# Patient Record
Sex: Female | Born: 1995 | Race: Black or African American | Hispanic: No | Marital: Married | Smoking: Never smoker
Health system: Southern US, Community
[De-identification: ages and names within clinical notes are randomized; demographics above are authoritative.]

## PROBLEM LIST (undated history)

## (undated) DIAGNOSIS — N912 Amenorrhea, unspecified: Secondary | ICD-10-CM

## (undated) DIAGNOSIS — E282 Polycystic ovarian syndrome: Secondary | ICD-10-CM

## (undated) DIAGNOSIS — D569 Thalassemia, unspecified: Secondary | ICD-10-CM

## (undated) HISTORY — DX: Polycystic ovarian syndrome: E28.2

## (undated) HISTORY — DX: Amenorrhea, unspecified: N91.2

---
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
## 2017-04-22 ENCOUNTER — Encounter (HOSPITAL_COMMUNITY): Payer: Self-pay

## 2017-04-22 ENCOUNTER — Emergency Department (HOSPITAL_COMMUNITY)
Admission: EM | Admit: 2017-04-22 | Discharge: 2017-04-22 | Disposition: A | Discharge status: Home / Self Care | Payer: BLUE CROSS/BLUE SHIELD | Attending: Emergency Medicine | Admitting: Emergency Medicine

## 2017-04-22 DIAGNOSIS — R1031 Right lower quadrant pain: Secondary | ICD-10-CM | POA: Diagnosis not present

## 2017-04-22 DIAGNOSIS — K529 Noninfective gastroenteritis and colitis, unspecified: Secondary | ICD-10-CM | POA: Diagnosis not present

## 2017-04-22 DIAGNOSIS — R197 Diarrhea, unspecified: Secondary | ICD-10-CM | POA: Diagnosis present

## 2017-04-22 LAB — POC URINE PREG, ED: PREG TEST UR: NEGATIVE

## 2017-04-22 LAB — COMPREHENSIVE METABOLIC PANEL
ALK PHOS: 61 U/L (ref 38–126)
ALT: 21 U/L (ref 14–54)
ANION GAP: 9 (ref 5–15)
AST: 25 U/L (ref 15–41)
Albumin: 4 g/dL (ref 3.5–5.0)
BILIRUBIN TOTAL: 0.4 mg/dL (ref 0.3–1.2)
BUN: 5 mg/dL — ABNORMAL LOW (ref 6–20)
CALCIUM: 9.8 mg/dL (ref 8.9–10.3)
CO2: 26 mmol/L (ref 22–32)
CREATININE: 0.86 mg/dL (ref 0.44–1.00)
Chloride: 103 mmol/L (ref 101–111)
GFR calc non Af Amer: >60 mL/min (ref >60)
GLUCOSE: 109 mg/dL — AB (ref 65–99)
Potassium: 3.7 mmol/L (ref 3.5–5.1)
Sodium: 138 mmol/L (ref 135–145)
TOTAL PROTEIN: 7.7 g/dL (ref 6.5–8.1)

## 2017-04-22 LAB — URINALYSIS, ROUTINE W REFLEX MICROSCOPIC
BILIRUBIN URINE: NEGATIVE
Bacteria, UA: NONE SEEN
GLUCOSE, UA: NEGATIVE mg/dL
Ketones, ur: NEGATIVE mg/dL
Leukocytes, UA: NEGATIVE
NITRITE: NEGATIVE
Protein, ur: NEGATIVE mg/dL
SPECIFIC GRAVITY, URINE: 1.011 (ref 1.005–1.030)
Squamous Epithelial / LPF: NONE SEEN
WBC UA: NONE SEEN WBC/hpf (ref 0–5)
pH: 7 (ref 5.0–8.0)

## 2017-04-22 LAB — CBC
HCT: 36.6 % (ref 36.0–46.0)
HEMOGLOBIN: 11.6 g/dL — AB (ref 12.0–15.0)
MCH: 23.2 pg — ABNORMAL LOW (ref 26.0–34.0)
MCHC: 31.7 g/dL (ref 30.0–36.0)
MCV: 73.1 fL — ABNORMAL LOW (ref 78.0–100.0)
PLATELETS: 268 10*3/uL (ref 150–400)
RBC: 5.01 MIL/uL (ref 3.87–5.11)
RDW: 14.1 % (ref 11.5–15.5)
WBC: 4.9 10*3/uL (ref 4.0–10.5)

## 2017-04-22 LAB — LIPASE, BLOOD: Lipase: 39 U/L (ref 11–51)

## 2017-04-22 MED ORDER — ONDANSETRON 8 MG PO TBDP: 8.0000 mg | ORAL_TABLET | Freq: Three times a day (TID) | ORAL | 0 refills | Status: DC | PRN

## 2017-04-22 MED ORDER — ONDANSETRON 4 MG PO TBDP
8.0000 mg | ORAL_TABLET | Freq: Once | ORAL | Status: AC
  Administered 2017-04-22: 8 mg via ORAL
  Filled 2017-04-22: qty 2

## 2017-04-22 NOTE — ED Provider Notes (Signed)
[facility]-EMERGENCY DEPT Provider Note   CSN: [id number] Arrival date & time: 04/22/17  0754     History   Chief Complaint Chief Complaint  Patient presents with  . Diarrhea  . Back Pain    HPI Mary Khan is a 21 y.o. female.  HPI Mary Khan is a 21 y.o. female with no medical problems, presents to emergency department complaining of nausea, diarrhea, right flank pain.atient states symptoms started yesterday. She states this morning her diarrhea has gotten worse. She reports 6 episodes of watery stool. She reports associated urinary frequency. No dysuria, urinary urgency, hematuria. She denies any emesis. She is able to drink and eat. She denies any abdominal pain. She denies any fever or chills. She states she had a UTI 3 months ago for which took antibiotics. She denies any blood in her stool. She states pain is worsened with movement. Nothing is making her symptoms better or worse. She tried Pepto-Bismol which did not help.  History reviewed. No pertinent past medical history.  There are no active problems to display for this patient.   History reviewed. No pertinent surgical history.  OB History    No data available       Home Medications    Prior to Admission medications   Medication Sig Start Date End Date Taking? Authorizing Provider  ibuprofen (ADVIL,MOTRIN) 400 MG tablet Take 1 tablet (400 mg total) by mouth every 6 (six) hours as needed for pain. 05/02/13   Burgess Amor, PA-C  naproxen (NAPROSYN) 500 MG tablet Take 1 tablet (500 mg total) by mouth 2 (two) times daily with a meal. 04/14/15   Triplett, Tammy, PA-C  Norgestimate-Ethinyl Estradiol Triphasic (ORTHO TRI-CYCLEN LO) 0.18/0.215/0.25 MG-25 MCG tab Take 1 tablet by mouth daily.    [provider]    Family History No family history on file.  Social History Social History  Substance Use Topics  . Smoking status: Never Smoker  . Smokeless tobacco: Never Used  . Alcohol use No      Allergies   Patient has no known allergies.   Review of Systems Review of Systems  Constitutional: Negative for chills and fever.  Respiratory: Negative for cough, chest tightness and shortness of breath.   Cardiovascular: Negative for chest pain, palpitations and leg swelling.  Gastrointestinal: Positive for abdominal pain, diarrhea and nausea. Negative for blood in stool and vomiting.  Genitourinary: Positive for flank pain and frequency. Negative for dysuria, hematuria, pelvic pain, urgency, vaginal bleeding, vaginal discharge and vaginal pain.  Musculoskeletal: Negative for arthralgias, myalgias, neck pain and neck stiffness.  Skin: Negative for rash.  Neurological: Negative for dizziness, weakness and headaches.  All other systems reviewed and are negative.    Physical Exam Updated Vital Signs BP 123/87   Pulse 92   Temp 98.8 F (37.1 C) (Oral)   Resp 18   Ht  (1.6 m)   Wt 63.5 kg (140 lb)   SpO2 100%   BMI 24.80 kg/m   Physical Exam  Constitutional: She is oriented to person, place, and time. She appears well-developed and well-nourished. No distress.  HENT:  Head: Normocephalic.  Eyes: Conjunctivae are normal.  Neck: Neck supple.  Cardiovascular: Normal rate, regular rhythm and normal heart sounds.   Pulmonary/Chest: Effort normal and breath sounds normal. No respiratory distress. She has no wheezes. She has no rales.  Abdominal: Soft. Bowel sounds are normal. She exhibits no distension. There is no tenderness. There is no rebound and no guarding.  No CVA tenderness bilaterally  Musculoskeletal: She exhibits no edema.  Neurological: She is alert and oriented to person, place, and time.  Skin: Skin is warm and dry.  Psychiatric: She has a normal mood and affect. Her behavior is normal.  Nursing note and vitals reviewed.    ED Treatments / Results  Labs (all labs ordered are listed, but only abnormal results are displayed) Labs Reviewed   COMPREHENSIVE METABOLIC PANEL - Abnormal; Notable for the following:       Result Value   Glucose, Bld 109 (*)    BUN 5 (*)    All other components within normal limits  CBC - Abnormal; Notable for the following:    Hemoglobin 11.6 (*)    MCV 73.1 (*)    MCH 23.2 (*)    All other components within normal limits  URINALYSIS, ROUTINE W REFLEX MICROSCOPIC - Abnormal; Notable for the following:    Color, Urine STRAW (*)    Hgb urine dipstick SMALL (*)    All other components within normal limits  LIPASE, BLOOD  POC URINE PREG, ED    EKG  EKG Interpretation None       Radiology No results found.  Procedures Procedures (including critical care time)  Medications Ordered in ED Medications  ondansetron (ZOFRAN-ODT) disintegrating tablet 8 mg (8 mg Oral Given 04/22/17 0850)     Initial Impression / Assessment and Plan / ED Course  I have reviewed the triage vital signs and the nursing notes.  Pertinent labs & imaging results that were available during my care of the patient were reviewed by me and considered in my medical decision making (see chart for details).     Patient emergency department with nausea, diarrhea, flank pain. On exam, no abdominal pain or tenderness. Patient is in no acute distress. Vital signs are normal. Will check labs, urinalysis, urine pregnancy. Zofran ordered for nausea.  9:37 AM Urine analysis with no signs of infection. Labs unremarkable. Pt not pregnant. Pt's  Nausea improved with zofran. Her abdomen is non tender. No episodes of diarrhea in ED. VS remain normal. Pt stable for dc home with close outpatient follow up. Will prescribe zofran for nausea  Vitals:   04/22/17 0757 04/22/17 0759 04/22/17 0830  BP: 134/88  123/87  Pulse: 78  92  Resp: 18    Temp: 98.8 F (37.1 C)    TempSrc: Oral    SpO2:   100%  Weight:  63.5 kg (140 lb)   Height:  5\' 3"  (1.6 m)       Final Clinical Impressions(s) / ED Diagnoses   Final diagnoses:   Gastroenteritis    New Prescriptions New Prescriptions   ONDANSETRON (ZOFRAN ODT) 8 MG DISINTEGRATING TABLET    Take 1 tablet (8 mg total) by mouth every 8 (eight) hours as needed for nausea or vomiting.     Jaynie CrumbleKirichenko, Dillon Livermore, PA-C 04/22/17 0940    Vanetta MuldersZackowski, Scott, MD 04/23/17 [phone]

## 2017-04-22 NOTE — Discharge Instructions (Signed)
Drink plenty of fluids. Rest. Take zofran as prescribed as needed for nausea and vomiting. Take imodium as needed for diarrhea. Follow up with family doctor as needed. Return if worsening.

## 2017-04-22 NOTE — ED Triage Notes (Signed)
Per Pt, Pt is coming from home with complaints of diarrhea, lower back pain, and urinary frequency that started this morning at 0200. Pt reports some nausea with no vomiting, but denies vaginal bleeding or discharge.

## 2018-07-20 ENCOUNTER — Ambulatory Visit (INDEPENDENT_AMBULATORY_CARE_PROVIDER_SITE_OTHER): Payer: BLUE CROSS/BLUE SHIELD | Admitting: Certified Nurse Midwife

## 2018-07-20 ENCOUNTER — Encounter: Payer: Self-pay | Admitting: Certified Nurse Midwife

## 2018-07-20 ENCOUNTER — Other Ambulatory Visit: Payer: Self-pay

## 2018-07-20 VITALS — BP 110/72 | HR 68 | Resp 16 | Ht 63.75 in | Wt 170.0 lb

## 2018-07-20 DIAGNOSIS — E663 Overweight: Secondary | ICD-10-CM

## 2018-07-20 DIAGNOSIS — N912 Amenorrhea, unspecified: Secondary | ICD-10-CM

## 2018-07-20 DIAGNOSIS — E049 Nontoxic goiter, unspecified: Secondary | ICD-10-CM | POA: Diagnosis not present

## 2018-07-20 DIAGNOSIS — Z3202 Encounter for pregnancy test, result negative: Secondary | ICD-10-CM

## 2018-07-20 DIAGNOSIS — Z01419 Encounter for gynecological examination (general) (routine) without abnormal findings: Secondary | ICD-10-CM | POA: Diagnosis not present

## 2018-07-20 LAB — POCT URINE PREGNANCY: Preg Test, Ur: NEGATIVE

## 2018-07-20 NOTE — Progress Notes (Signed)
22 y.o. G0P0000 Single  African American Fe here to establish gyn care and  for annual exam. LMP 04/03/18 with negative UPT at home and negative UPT here today. Patient feels she has had 3 periods this year, every 3-4 months, duration 5-6 days, 3rd day heavy, other moderate to light, some cramping with OTC medication use. Last sexual activity was 07/15/18. Steady partner, no STD screening needed. Previous use of OCP with no period in the past also. No other health issues today.  Patient's last menstrual period was 04/03/2018.          Sexually active: Yes.    The current method of family planning is non at present, condoms and pills in past.   Exercising: Yes.    zumba Smoker:  no  Review of Systems  Constitutional: Negative.   HENT: Negative.   Eyes: Negative.   Respiratory: Negative.   Cardiovascular: Negative.   Gastrointestinal: Negative.   Genitourinary:       Amenorrhea  Musculoskeletal: Negative.   Skin: Negative.   Neurological: Negative.   Endo/Heme/Allergies: Negative.   Psychiatric/Behavioral: Negative.     Health Maintenance: Pap:  Unsure, maybe done at health department History of Abnormal Pap: no MMG:  none Self Breast exams: no Colonoscopy:  none BMD:   none TDaP:  2017 Shingles: no Pneumonia: no Hep C and HIV: not done Labs: UPT-neg   reports that she has never smoked. She has never used smokeless tobacco. She reports that she does not drink alcohol or use drugs.  Past Medical History:  Diagnosis Date  . Amenorrhea     History reviewed. No pertinent surgical history.  No current outpatient medications on file.   No current facility-administered medications for this visit.     Family History  Problem Relation Age of Onset  . Diabetes Mother   . Hyperlipidemia Maternal Grandmother   . Diabetes Paternal Grandmother   . Diabetes Paternal Grandfather     ROS:  Pertinent items are noted in HPI.  Otherwise, a comprehensive ROS was negative.  Exam:    BP 110/72   Pulse 68   Resp 16   Ht 5' 3.75" (1.619 m)   Wt 170 lb (77.1 kg)   LMP 04/03/2018   BMI 29.41 kg/m  Height: 5' 3.75" (161.9 cm) Ht Readings from Last 3 Encounters:  07/20/18 5' 3.75" (1.619 m)  04/22/17 5\' 3"  (1.6 m) (31 %, Z= -0.50)*  04/14/15 5\' 4"  (1.626 m) (48 %, Z= -0.05)*   * Growth percentiles are based on CDC (Girls, 2-20 Years) data.    General appearance: alert, cooperative and appears stated age Head: Normocephalic, without obvious abnormality, atraumatic Neck: no adenopathy, supple, symmetrical, trachea midline and thyroid enlarged and questionable nodule palpated on right Lungs: clear to auscultation bilaterally Breasts: normal appearance, no masses or tenderness, No nipple retraction or dimpling, No nipple discharge or bleeding, No axillary or supraclavicular adenopathy, Taught monthly breast self examination Heart: regular rate and rhythm Abdomen: soft, non-tender; no masses,  no organomegaly Extremities: extremities normal, atraumatic, no cyanosis or edema Skin: Skin color, texture, turgor normal. No rashes or lesions Lymph nodes: Cervical, supraclavicular, and axillary nodes normal. No abnormal inguinal nodes palpated Neurologic: Grossly normal   Pelvic: External genitalia:  no lesions              Urethra:  normal appearing urethra with no masses, tenderness or lesions              Bartholin's and Skene's:  normal                 Vagina: normal appearing vagina with normal color and discharge, no lesions              Cervix: no cervical motion tenderness, no lesions and nulliparous appearance              Pap taken: No. Bimanual Exam:  Uterus:  normal size, contour, position, consistency, mobility, non-tender and anteverted              Adnexa: normal adnexa and no mass, fullness, tenderness               Rectovaginal: Confirms               Anus:  Normal appearance no lesions  Chaperone present: yes  A:  Well Woman with normal  exam  Amenorrhea with irregular cycles, negative  UPT   History of amenorrhea with OCP use for cycle control and contraception, stopped using condoms for contraception  Enlarged thyroid  Overweight  P:   Reviewed health and wellness pertinent to exam  Discussed amenorrhea can occur with hormone  Or weight change, pituitary change or thyroid changes. Recommend labs for evaluation agreeable. Declines contraception at this point.  Lab: TSH with panel, Prolactin, FSH  Discussed evaluation of thyroid with US and she will be called with information to schedule  Discussed working on good diet and exercise, can help with hormonal changes. Patient to continue to keep period calendar and consistent condom use, so can treat with Provera challenge if needed. Questions addressed at length.  Pap smear: no   counseled on breast self exam, STD prevention, HIV risk factors and prevention, family planning choices, adequate intake of calcium and vitamin D, diet and exercise  return annually or prn  An After Visit Summary was printed and given to the patient.

## 2018-07-20 NOTE — Patient Instructions (Signed)
General topics  Next pap or exam is  due in 1 year Take a Women's multivitamin Take 1200 mg. of calcium daily - prefer dietary If any concerns in interim to call back  Breast Self-Awareness Practicing breast self-awareness may pick up problems early, prevent significant medical complications, and possibly save your life. By practicing breast self-awareness, you can become familiar with how your breasts look and feel and if your breasts are changing. This allows you to notice changes early. It can also offer you some reassurance that your breast health is good. One way to learn what is normal for your breasts and whether your breasts are changing is to do a breast self-exam. If you find a lump or something that was not present in the past, it is best to contact your caregiver right away. Other findings that should be evaluated by your caregiver include nipple discharge, especially if it is bloody; skin changes or reddening; areas where the skin seems to be pulled in (retracted); or new lumps and bumps. Breast pain is seldom associated with cancer (malignancy), but should also be evaluated by a caregiver. BREAST SELF-EXAM The best time to examine your breasts is 5 7 days after your menstrual period is over.  ExitCare Patient Information 2013 [employer].   Exercise to Stay Healthy Exercise helps you become and stay healthy. EXERCISE IDEAS AND TIPS Choose exercises that:  You enjoy.  Fit into your day. You do not need to exercise really hard to be healthy. You can do exercises at a slow or medium level and stay healthy. You can:  Stretch before and after working out.  Try yoga, Pilates, or tai chi.  Lift weights.  Walk fast, swim, jog, run, climb stairs, bicycle, dance, or rollerskate.  Take aerobic classes. Exercises that burn about 150 calories:  Running 1  miles in 15 minutes.  Playing volleyball for 45 to 60 minutes.  Washing and waxing a car for 45 to 60  minutes.  Playing touch football for 45 minutes.  Walking 1  miles in 35 minutes.  Pushing a stroller 1  miles in 30 minutes.  Playing basketball for 30 minutes.  Raking leaves for 30 minutes.  Bicycling 5 miles in 30 minutes.  Walking 2 miles in 30 minutes.  Dancing for 30 minutes.  Shoveling snow for 15 minutes.  Swimming laps for 20 minutes.  Walking up stairs for 15 minutes.  Bicycling 4 miles in 15 minutes.  Gardening for 30 to 45 minutes.  Jumping rope for 15 minutes.  Washing windows or floors for 45 to 60 minutes. Document Released: 09/03/2010 Document Revised: 10/24/2011 Document Reviewed: 09/03/2010 ExitCare Patient Information 2013 [employer].   Other topics ( that may be useful information):    Sexually Transmitted Disease Sexually transmitted disease (STD) refers to any infection that is passed from person to person during sexual activity. This may happen by way of saliva, semen, blood, vaginal mucus, or urine. Common STDs include:  Gonorrhea.  Chlamydia.  Syphilis.  HIV/AIDS.  Genital herpes.  Hepatitis B and C.  Trichomonas.  Human papillomavirus (HPV).  Pubic lice. CAUSES  An STD may be spread by bacteria, virus, or parasite. A person can get an STD by:  Sexual intercourse with an infected person.  Sharing sex toys with an infected person.  Sharing needles with an infected person.  Having intimate contact with the genitals, mouth, or rectal areas of an infected person. SYMPTOMS  Some people may not have any symptoms, but   they can still pass the infection to others. Different STDs have different symptoms. Symptoms include:  Painful or bloody urination.  Pain in the pelvis, abdomen, vagina, anus, throat, or eyes.  Skin rash, itching, irritation, growths, or sores (lesions). These usually occur in the genital or anal area.  Abnormal vaginal discharge.  Penile discharge in men.  Soft, flesh-colored skin growths in the  genital or anal area.  Fever.  Pain or bleeding during sexual intercourse.  Swollen glands in the groin area.  Yellow skin and eyes (jaundice). This is seen with hepatitis. DIAGNOSIS  To make a diagnosis, your caregiver may:  Take a medical history.  Perform a physical exam.  Take a specimen (culture) to be examined.  Examine a sample of discharge under a microscope.  Perform blood test TREATMENT   Chlamydia, gonorrhea, trichomonas, and syphilis can be cured with antibiotic medicine.  Genital herpes, hepatitis, and HIV can be treated, but not cured, with prescribed medicines. The medicines will lessen the symptoms.  Genital warts from HPV can be treated with medicine or by freezing, burning (electrocautery), or surgery. Warts may come back.  HPV is a virus and cannot be cured with medicine or surgery.However, abnormal areas may be followed very closely by your caregiver and may be removed from the cervix, vagina, or vulva through office procedures or surgery. If your diagnosis is confirmed, your recent sexual partners need treatment. This is true even if they are symptom-free or have a negative culture or evaluation. They should not have sex until their caregiver says it is okay. HOME CARE INSTRUCTIONS  All sexual partners should be informed, tested, and treated for all STDs.  Take your antibiotics as directed. Finish them even if you start to feel better.  Only take over-the-counter or prescription medicines for pain, discomfort, or fever as directed by your caregiver.  Rest.  Eat a balanced diet and drink enough fluids to keep your urine clear or pale yellow.  Do not have sex until treatment is completed and you have followed up with your caregiver. STDs should be checked after treatment.  Keep all follow-up appointments, Pap tests, and blood tests as directed by your caregiver.  Only use latex condoms and water-soluble lubricants during sexual activity. Do not use  petroleum jelly or oils.  Avoid alcohol and illegal drugs.  Get vaccinated for HPV and hepatitis. If you have not received these vaccines in the past, talk to your caregiver about whether one or both might be right for you.  Avoid risky sex practices that can break the skin. The only way to avoid getting an STD is to avoid all sexual activity.Latex condoms and dental dams (for oral sex) will help lessen the risk of getting an STD, but will not completely eliminate the risk. SEEK MEDICAL CARE IF:   You have a fever.  You have any new or worsening symptoms. Document Released: 10/22/2002 Document Revised: 10/24/2011 Document Reviewed: 10/29/2010 [facility] Patient Information 2013 [employer].    Domestic Abuse You are being battered or abused if someone close to you hits, pushes, or physically hurts you in any way. You also are being abused if you are forced into activities. You are being sexually abused if you are forced to have sexual contact of any kind. You are being emotionally abused if you are made to feel worthless or if you are constantly threatened. It is important to remember that help is available. No one has the right to abuse you. PREVENTION OF FURTHER  ABUSE  Learn the warning signs of danger. This varies with situations but may include: the use of alcohol, threats, isolation from friends and family, or forced sexual contact. Leave if you feel that violence is going to occur.  If you are attacked or beaten, report it to the police so the abuse is documented. You do not have to press charges. The police can protect you while you or the attackers are leaving. Get the officer's name and badge number and a copy of the report.  Find someone you can trust and tell them what is happening to you: your caregiver, a nurse, clergy member, close friend or family member. Feeling ashamed is natural, but remember that you have done nothing wrong. No one deserves abuse. Document Released:  07/29/2000 Document Revised: 10/24/2011 Document Reviewed: 10/07/2010 ExitCare Patient Information 2013 [employer].    How Much is Too Much Alcohol? Drinking too much alcohol can cause injury, accidents, and health problems. These types of problems can include:   Car crashes.  Falls.  Family fighting (domestic violence).  Drowning.  Fights.  Injuries.  Burns.  Damage to certain organs.  Having a baby with birth defects. ONE DRINK CAN BE TOO MUCH WHEN YOU ARE:  Working.  Pregnant or breastfeeding.  Taking medicines. Ask your doctor.  Driving or planning to drive. If you or someone you know has a drinking problem, get help from a doctor.  Document Released: 05/28/2009 Document Revised: 10/24/2011 Document Reviewed: 05/28/2009 ExitCare Patient Information 2013 [employer].   Smoking Hazards Smoking cigarettes is extremely bad for your health. Tobacco smoke has over 200 known poisons in it. There are over 60 chemicals in tobacco smoke that cause cancer. Some of the chemicals found in cigarette smoke include:   Cyanide.  Benzene.  Formaldehyde.  Methanol (wood alcohol).  Acetylene (fuel used in welding torches).  Ammonia. Cigarette smoke also contains the poisonous gases nitrogen oxide and carbon monoxide.  Cigarette smokers have an increased risk of many serious medical problems and Smoking causes approximately:  90% of all lung cancer deaths in men.  80% of all lung cancer deaths in women.  90% of deaths from chronic obstructive lung disease. Compared with nonsmokers, smoking increases the risk of:  Coronary heart disease by 2 to 4 times.  Stroke by 2 to 4 times.  Men developing lung cancer by 23 times.  Women developing lung cancer by 13 times.  Dying from chronic obstructive lung diseases by 12 times.  . Smoking is the most preventable cause of death and disease in our society.  WHY IS SMOKING ADDICTIVE?  Nicotine is the chemical  agent in tobacco that is capable of causing addiction or dependence.  When you smoke and inhale, nicotine is absorbed rapidly into the bloodstream through your lungs. Nicotine absorbed through the lungs is capable of creating a powerful addiction. Both inhaled and non-inhaled nicotine may be addictive.  Addiction studies of cigarettes and spit tobacco show that addiction to nicotine occurs mainly during the teen years, when young people begin using tobacco products. WHAT ARE THE BENEFITS OF QUITTING?  There are many health benefits to quitting smoking.   Likelihood of developing cancer and heart disease decreases. Health improvements are seen almost immediately.  Blood pressure, pulse rate, and breathing patterns start returning to normal soon after quitting. QUITTING SMOKING   American Lung Association - 1-800-LUNGUSA  American Cancer Society - 1-800-ACS-2345 Document Released: 09/08/2004 Document Revised: 10/24/2011 Document Reviewed: 05/13/2009 ExitCare Patient Information 2013 [employer]Care[employer]   LL[employer].   Stress Management Stress is a state of physical or mental tension that often results from changes in your life or normal routine. Some common causes of stress are:  Death of a loved one.  Injuries or severe illnesses.  Getting fired or changing jobs.  Moving into a new home. Other causes may be:  Sexual problems.  Business or financial losses.  Taking on a large debt.  Regular conflict with someone at home or at work.  Constant tiredness from lack of sleep. It is not just bad things that are stressful. It may be stressful to:  Win the lottery.  Get married.  Buy a new car. The amount of stress that can be easily tolerated varies from person to person. Changes generally cause stress, regardless of the types of change. Too much stress can affect your health. It may lead to physical or emotional problems. Too little stress (boredom) may also become stressful. SUGGESTIONS TO  REDUCE STRESS:  Talk things over with your family and friends. It often is helpful to share your concerns and worries. If you feel your problem is serious, you may want to get help from a professional counselor.  Consider your problems one at a time instead of lumping them all together. Trying to take care of everything at once may seem impossible. List all the things you need to do and then start with the most important one. Set a goal to accomplish 2 or 3 things each day. If you expect to do too many in a single day you will naturally fail, causing you to feel even more stressed.  Do not use alcohol or drugs to relieve stress. Although you may feel better for a short time, they do not remove the problems that caused the stress. They can also be habit forming.  Exercise regularly - at least 3 times per week. Physical exercise can help to relieve that "uptight" feeling and will relax you.  The shortest distance between despair and hope is often a good night's sleep.  Go to bed and get up on time allowing yourself time for appointments without being rushed.  Take a short "time-out" period from any stressful situation that occurs during the day. Close your eyes and take some deep breaths. Starting with the muscles in your face, tense them, hold it for a few seconds, then relax. Repeat this with the muscles in your neck, shoulders, hand, stomach, back and legs.  Take good care of yourself. Eat a balanced diet and get plenty of rest.  Schedule time for having fun. Take a break from your daily routine to relax. HOME CARE INSTRUCTIONS   Call if you feel overwhelmed by your problems and feel you can no longer manage them on your own.  Return immediately if you feel like hurting yourself or someone else. Document Released: 01/25/2001 Document Revised: 10/24/2011 Document Reviewed: 09/17/2007 ExitCare Patient Information 2013 [employer].   

## 2018-07-21 LAB — THYROID PANEL WITH TSH
FREE THYROXINE INDEX: 2.9 (ref 1.2–4.9)
T3 UPTAKE RATIO: 29 % (ref 24–39)
T4, Total: 10 ug/dL (ref 4.5–12.0)
TSH: 1.97 u[IU]/mL (ref 0.450–4.500)

## 2018-07-21 LAB — PROLACTIN: PROLACTIN: 12.9 ng/mL (ref 4.8–23.3)

## 2018-07-21 LAB — FOLLICLE STIMULATING HORMONE: FSH: 4.8 m[IU]/mL

## 2018-07-22 ENCOUNTER — Other Ambulatory Visit: Payer: Self-pay | Admitting: Certified Nurse Midwife

## 2018-07-22 DIAGNOSIS — E049 Nontoxic goiter, unspecified: Secondary | ICD-10-CM

## 2018-07-22 DIAGNOSIS — N912 Amenorrhea, unspecified: Secondary | ICD-10-CM

## 2018-07-24 ENCOUNTER — Telehealth: Payer: Self-pay | Admitting: Certified Nurse Midwife

## 2018-07-24 ENCOUNTER — Ambulatory Visit
Admission: RE | Admit: 2018-07-24 | Discharge: 2018-07-24 | Disposition: A | Discharge status: Home / Self Care | Payer: BLUE CROSS/BLUE SHIELD | Source: Ambulatory Visit | Attending: Certified Nurse Midwife | Admitting: Certified Nurse Midwife

## 2018-07-24 DIAGNOSIS — E049 Nontoxic goiter, unspecified: Secondary | ICD-10-CM

## 2018-07-24 DIAGNOSIS — E011 Iodine-deficiency related multinodular (endemic) goiter: Secondary | ICD-10-CM | POA: Diagnosis not present

## 2018-07-24 NOTE — Telephone Encounter (Signed)
Patient requesting results from lab work.

## 2018-07-24 NOTE — Telephone Encounter (Signed)
Left message to call [facility]ssonJill, [facility] [phone].   Notes recorded by Verner CholLeonard, Deborah S, CNM on 07/22/2018 at 9:38 PM EST Notify patient her prolactin level, TSH and FSH are normal. She will need serum HCG in two weeks if negative will do provera challenge. We discussed consistent condom use or no sexual activity , so she could be treated once HCG level in. Order placed. TSH with panel is normal, but thyroid enlarged with questionable nodule, order placed and she will be called with information .

## 2018-07-25 NOTE — Telephone Encounter (Signed)
Patient is returning a call to Jill. °

## 2018-07-25 NOTE — Telephone Encounter (Signed)
-----   Message from Verner Choleborah S Leonard, CNM sent at 07/24/2018  8:03 PM EST ----- Notify patient her thyroid US showed only enlargement, no nodules. No further evaluation needed. Will follow in office with aex yearly

## 2018-07-25 NOTE — Telephone Encounter (Signed)
Left message to call Pratyush Ammon, RN at [facility] [phone].   

## 2018-07-25 NOTE — Telephone Encounter (Signed)
Spoke with patient, advised of all results as seen below per Leota Sauerseborah Leonard, CNM. Lab appt scheduled for 08/09/18 at 9am. Patient verbalizes understanding and is agreeable. Encounter closed.

## 2018-08-06 ENCOUNTER — Telehealth: Payer: Self-pay | Admitting: Certified Nurse Midwife

## 2018-08-06 NOTE — Telephone Encounter (Signed)
Ok to cancel hCG lab.

## 2018-08-06 NOTE — Telephone Encounter (Signed)
Left detailed message, ok per dpr. Advised ok to cancel Hcg lab scheduled for 12/26, appt cacncelled. Return call to office if any additional questions.   Encounter closed

## 2018-08-06 NOTE — Telephone Encounter (Signed)
Hi Dr. Darcel BayleyLeonard,    My period has finally made its arrival! Did you still want me to come in for the HCG blood test on the 26th?

## 2018-08-06 NOTE — Telephone Encounter (Signed)
07/20/18 AEX with DL Reviewed labs dated 46/04/6211/6/19. Hcg for amenorrhea, provera if negative.  Prolactin, FSH, TSH normal.   Routing to covering provider to advise on hcg labs.   Dr. Edward JollySilva -ok to cancel Hcg labs?   Cc: Leota Sauerseborah Leonard, CNM

## 2018-08-09 ENCOUNTER — Other Ambulatory Visit (INDEPENDENT_AMBULATORY_CARE_PROVIDER_SITE_OTHER): Payer: BLUE CROSS/BLUE SHIELD

## 2018-08-09 ENCOUNTER — Other Ambulatory Visit: Payer: Self-pay

## 2018-08-09 DIAGNOSIS — N912 Amenorrhea, unspecified: Secondary | ICD-10-CM | POA: Diagnosis not present

## 2018-08-10 LAB — HCG, SERUM, QUALITATIVE: hCG,Beta Subunit,Qual,Serum: NEGATIVE m[IU]/mL (ref <6)

## 2018-09-19 ENCOUNTER — Telehealth: Payer: Self-pay | Admitting: Certified Nurse Midwife

## 2018-09-19 NOTE — Telephone Encounter (Signed)
Message   Hey Dr. Darcel Bayley,    I have not heard from you. Could I possibly be prescribed a prescription like Clomid or Provera as I am not sure that I am ovulating since my periods are 3-4 months apart.    Thank you

## 2018-09-19 NOTE — Telephone Encounter (Signed)
Spoke with patient. Patient returned to office on 08/09/18 for repeat Hcg, if negative was to start provera. No Rx for provera sent.  LMP 08/05/18.   Patient is SA, does not use condoms for contraceptive. Last SA 1 wk ago.   Advised I will review with Leota Sauers, CNM to advise on how to proceed and return call, patient agreeable.    Leota Sauers, CNM -please advise.

## 2018-09-20 NOTE — Telephone Encounter (Signed)
Spoke with patient, advised as seen below per Deborah Leonard, CNM. Patient verbalizes understanding and is agreeable.   Encounter closed.  

## 2018-09-20 NOTE — Telephone Encounter (Signed)
If she had period in 12/19. I would not do Provera until she has gone without period for 3 months then would treat with Provera. If she starts period this month needs to call and we can start contraception with this period to try to re-establish regular menses

## 2018-10-10 DIAGNOSIS — Z6830 Body mass index (BMI) 30.0-30.9, adult: Secondary | ICD-10-CM | POA: Diagnosis not present

## 2018-10-10 DIAGNOSIS — N926 Irregular menstruation, unspecified: Secondary | ICD-10-CM | POA: Diagnosis not present

## 2018-10-10 DIAGNOSIS — Z3202 Encounter for pregnancy test, result negative: Secondary | ICD-10-CM | POA: Diagnosis not present

## 2018-10-29 DIAGNOSIS — N926 Irregular menstruation, unspecified: Secondary | ICD-10-CM | POA: Diagnosis not present

## 2018-12-25 DIAGNOSIS — E049 Nontoxic goiter, unspecified: Secondary | ICD-10-CM | POA: Diagnosis not present

## 2018-12-25 DIAGNOSIS — Z1389 Encounter for screening for other disorder: Secondary | ICD-10-CM | POA: Diagnosis not present

## 2018-12-25 DIAGNOSIS — Z68.41 Body mass index (BMI) pediatric, less than 5th percentile for age: Secondary | ICD-10-CM | POA: Diagnosis not present

## 2019-04-03 DIAGNOSIS — E282 Polycystic ovarian syndrome: Secondary | ICD-10-CM | POA: Diagnosis not present

## 2019-05-09 ENCOUNTER — Ambulatory Visit: Payer: BLUE CROSS/BLUE SHIELD | Admitting: Internal Medicine

## 2019-05-10 ENCOUNTER — Other Ambulatory Visit: Payer: Self-pay

## 2019-05-14 ENCOUNTER — Ambulatory Visit (INDEPENDENT_AMBULATORY_CARE_PROVIDER_SITE_OTHER): Payer: BC Managed Care – PPO | Admitting: Internal Medicine

## 2019-05-14 ENCOUNTER — Encounter: Payer: Self-pay | Admitting: Internal Medicine

## 2019-05-14 ENCOUNTER — Other Ambulatory Visit: Payer: Self-pay

## 2019-05-14 VITALS — BP 120/78 | HR 90 | Ht 63.75 in | Wt 162.0 lb

## 2019-05-14 DIAGNOSIS — E282 Polycystic ovarian syndrome: Secondary | ICD-10-CM | POA: Diagnosis not present

## 2019-05-14 DIAGNOSIS — E049 Nontoxic goiter, unspecified: Secondary | ICD-10-CM

## 2019-05-14 NOTE — Patient Instructions (Signed)
Please return for labs in am, in the 3rd-5th day of your next menstrual cycle.  Please look up Resveratrol.   Please schedule an appt with Mary Khan with nutrition.  Please come back for a follow-up appointment in 6 months.

## 2019-05-14 NOTE — Progress Notes (Addendum)
Patient ID: Mary Khan, female   DOB: [birth year], 23 y.o.   MRN: [id number]    HPI  Mary Khan is a 23 y.o.-year-old female, referred by Dr. Phillips Odor, for evaluation for goiter and PCOS.  Goiter: Thyroid U/S (07/24/2018): Normal sized thyroid, without nodules: Parenchymal Echotexture: Normal Isthmus: 0.1 cm Right lobe: 4.2 x 1.7 x 1.8 cm Left lobe: 4.3 x 1.5 x 1.5 cm Estimated total number of nodules >/= 1 cm: 0 IMPRESSION: Normal sonographic appearance of the thyroid gland.  Pt denies: - feeling nodules in neck - hoarseness - dysphagia - choking - SOB with lying down  I reviewed pt's most recent thyroid tests-normal: 12/25/2018: TSH 1.28, total T4 9 (4.5-12) 11/06/2018: TSH 2.55 Lab Results  Component Value Date   TSH 1.970 07/20/2018    She has family history of thyroid disease in grandmother and grandfather. No recent contrast studies. No steroid use. No herbal supplements. No Biotin supplements or Hair, Skin and Nails vitamins.  PCOS:  Fertility/Menstrual cycles: - menarche at 23 y/o, initially regular then irregular menses at 23 y/o after starting OCPs  - no h/o ovarian cysts  - children: 0 - miscarriages: 0 - contraception: OCP x 2 years, then Depo Provera >> menses stopped at 69. She was on DepoProvera x 1 year >> amenorrhea >> restarted OCPs >> now irregular menses + spotting.  She was having 4 cycles a year, now monthly in last 2 mo, LMP 04/29/2019.  Acne: - no, rarely gets 1 or 2 acne spots  Hirsutism: - on upper neck and chin - plucks - also in aunt  Weight gain: - in 2013: weight 115 lbs - gained ~30 lbs on Provera: size 3 >> 10 - max weight 175 - no steroid use - no weight loss meds - Meals: - Breakfast: eggs, toast - Lunch: salad - Dinner: salmon, broccoli, sweet potatoes - Snacks:  Greek yoghurt, nuts Drinks: no - Diets tried: fish, salads - Exercise: walking 2x a week  Treatments tried: - tried Metformin >> diarrhea, nausea,  dizziness >> now off - did not try Spironolactone - did not try Vaniqa - off OCPs since 23 y/o - On Ovasitol  Labs reviewed per records brought by patient: 11/06/2018:  17HO Progesterone 32 DHEAS 474.1 (110-431.7) FSH/LH 6.9/13.3 PRL 17.2 T Testosterone/freeTestosterone: 51/7 (8-48/0-4.2)  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 She just bought a house.   ROS: Constitutional: + Fatigue, + poor sleep Eyes: no blurry vision, no xerophthalmia ENT: no sore throat,  + see HPI Cardiovascular: no CP/SOB/palpitations/leg swelling Respiratory: no cough/SOB Gastrointestinal: no N/V/D/C Musculoskeletal: no muscle/joint aches Skin: no rashes + excessive hair growth on chin and neck (see HPI) Neurological: no tremors/numbness/tingling/dizziness Psychiatric: no depression/+ anxiety + Low libido + Irregular menses (see HPI)  Past Medical History:  Diagnosis Date  . Amenorrhea    No past surgical history on file. Social History   Socioeconomic History  . Marital status: Single    Spouse name: Not on file  . Number of children: 0  . Years of education: Not on file  . Highest education level: Not on file  Occupational History  .  Just finished college, Stage manager  Social Needs  . Financial resource strain: Not on file  . Food insecurity    Worry: Not on file    Inability: Not on file  . Transportation needs    Medical: Not on file    Non-medical: Not on file  Tobacco Use  .  Smoking status: Never Smoker  . Smokeless tobacco: Never Used  Substance and Sexual Activity  . Alcohol use: No  . Drug use: No  . Sexual activity: Yes    Partners: Male    Birth control/protection: None   No current outpatient medications on file prior to visit.   No current facility-administered medications on file prior to visit.    No Known Allergies Family History  Problem Relation Age of Onset  . Diabetes Mother   . Hyperlipidemia Maternal Grandmother   . Diabetes Paternal Grandmother    . Diabetes Paternal Grandfather   HL, heart disease, thyroid disease in grandmother Thyroid disease in grandfather  PE: BP 120/78   Pulse 90   Ht 5' 3.75" (1.619 m) Comment: measured without shoes  Wt 162 lb (73.5 kg)   LMP 04/29/2019   SpO2 98%   BMI 28.03 kg/m  Wt Readings from Last 3 Encounters:  05/14/19 162 lb (73.5 kg)  07/20/18 170 lb (77.1 kg)  04/22/17 140 lb (63.5 kg) (72 %, Z= 0.58)*   * Growth percentiles are based on CDC (Girls, 2-20 Years) data.   Constitutional: Slightly overweight, in NAD Eyes: PERRLA, EOMI, no exophthalmos ENT: moist mucous membranes, no palpable thyromegaly, but prominent neck, no cervical lymphadenopathy Cardiovascular: RRR, No MRG Respiratory: CTA B Gastrointestinal: abdomen soft, NT, ND, BS+ Musculoskeletal: no deformities, strength intact in all 4;  Skin: moist, warm, no rashes, + no acne, + few wiry hair shafts on chin and neck, lanugo on sideburns, no acanthosis nigricans Neurological: no tremor with outstretched hands, DTR normal in all 4  ASSESSMENT: 1. Goiter  2.  PCOS  PLAN: 1.  Goiter - I reviewed the report of her thyroid ultrasound along with the patient. I pointed out that the thyroid is normal in size, without nodules. -Her thyroid tests were normal in 2019 and 2020 and she does not have a history of hypo-or hyperthyroidism -We discussed that the perceived thyromegaly on exam is most likely due to positioning of the thyroid more anteriorly in the neck or due to her slightly prominent sternocleidomastoid muscles, but not to an increase in the gland size or any nodules -No follow-up is necessary  2.  PCOS -We reviewed together her labs from OB/GYN which point towards PCOS: High testosterone and DHEAS, LH/FSH 2/1, normal prolactin and 17 hydroxyprogesterone. I had a long discussion with the patient about the fact that the PCOS is a misnomer, a patient does not necessarily have to have polycystic ovaries to be diagnosed with  the disorder. This is of sum of several conditions, including:  weight gain  insulin resistance (and therefore a higher risk of developing diabetes later in life)  acne  hirsutism  irregular menstrual cycles  decreased fertility. - We also discussed about the fact that the treatment is usually targeted to addressing the problem that concerns the patient the most: acne/hirsutism, weight gain, or fertility, but there is no single treatment for PCOS.  In her case, she is mostly worried about her irregular menstrual cycles but would also like to lose weight. - The first-line therapy are oral contraceptives. If she is concerned with her weight, we can use metformin; if she is concerned about acne/hirsutism, we can add spironolactone; and if she is concerned about fertility, I could refer her to reproductive endocrinology for possible use of clomiphene. -She used oral contraceptives in the past but developed amenorrhea afterwards and it took a long time to resume menstrual cycles.  She has  at least 4 cycles a year and in the last 2 months she got her menstrual cycle every month.  We discussed that, to prevent uterine lining hyperplasia, she needs to have at least 4 cycles a year.  She does not have a significant amount of hirsutism.  In this case, I would not suggest OCPs at is the first line.  However, we discussed that metformin can help with normalizing menstrual cycles and can also help with weight loss.  However, since she could not tolerate regular metformin, I suggested metformin ER.  She agrees to try this.  We will try a lower dose.  If she cannot tolerate metformin, I advised her to look up resveratrol.  She is already on inositol, which I advised her to continue. -We also discussed about the importance of losing weight and I suggested to increase the amount of exercise, by adding cardio and high intensity interval training to her walking sessions.  She also needs to build up to 5 exercise  episodes a week. -We also discussed about a referral to nutrition, with which she agrees -I would first want to repeat her hormonal labs to see if there is any improvement from 6 months ago (but will not repeat the prolactin and 17 hydroxyprogesterone).  I will also add an HbA1c.  She will return 3 to 5 days after her first day of menstrual cycle for the above labs.  We cannot check the labs today since she may be ovulating. -I will see the patient back in 6 months  We will most likely start metformin ER after labs are back.  Orders Placed This Encounter  Procedures  . Luteinizing hormone  . Follicle stimulating hormone  . Testosterone Free with SHBG  . DHEA-Sulfate, Serum  . Estradiol  . Hemoglobin A1c  . Amb ref to Medical Nutrition Therapy-MNT   Component     Latest Ref Rng & Units 05/30/2019  Testosterone, Serum (Total)     ng/dL 20  % Free Testosterone     % 2.9  Free Testosterone, S     pg/mL 5.8  Sex Hormone Binding Globulin     nmol/L 17.4 (L)  Estradiol, Free     pg/mL 0.57  Estradiol     pg/mL 20  FSH     mIU/ML 5.7  Hemoglobin A1C     4.6 - 6.5 % 5.8  DHEA-Sulfate, LCMS     ug/dL 474 (H)  LH     mIU/mL 5.28   Normal free testosterone but high DHEA-S.  DHEA-S is and androgen precursor any can be elevated in PCOS.  I would suggest to start Metformin ER.  We will send this to her pharmacy.  Philemon Kingdom, MD PhD [facility]

## 2019-05-17 ENCOUNTER — Other Ambulatory Visit: Payer: Self-pay

## 2019-05-17 DIAGNOSIS — Z20822 Contact with and (suspected) exposure to covid-19: Secondary | ICD-10-CM

## 2019-05-18 LAB — NOVEL CORONAVIRUS, NAA: SARS-CoV-2, NAA: NOT DETECTED

## 2019-05-30 ENCOUNTER — Other Ambulatory Visit: Payer: Self-pay

## 2019-05-30 ENCOUNTER — Other Ambulatory Visit (INDEPENDENT_AMBULATORY_CARE_PROVIDER_SITE_OTHER): Payer: BC Managed Care – PPO

## 2019-05-30 DIAGNOSIS — E282 Polycystic ovarian syndrome: Secondary | ICD-10-CM | POA: Diagnosis not present

## 2019-05-30 LAB — FOLLICLE STIMULATING HORMONE: FSH: 5.7 m[IU]/mL

## 2019-05-30 LAB — HEMOGLOBIN A1C: Hgb A1c MFr Bld: 5.8 % (ref 4.6–6.5)

## 2019-05-30 LAB — LUTEINIZING HORMONE: LH: 5.28 m[IU]/mL

## 2019-06-05 LAB — ESTRADIOL, FREE
Estradiol, Free: 0.57 pg/mL
Estradiol: 20 pg/mL

## 2019-06-07 DIAGNOSIS — Z6827 Body mass index (BMI) 27.0-27.9, adult: Secondary | ICD-10-CM | POA: Diagnosis not present

## 2019-06-07 DIAGNOSIS — Z118 Encounter for screening for other infectious and parasitic diseases: Secondary | ICD-10-CM | POA: Diagnosis not present

## 2019-06-07 DIAGNOSIS — G43909 Migraine, unspecified, not intractable, without status migrainosus: Secondary | ICD-10-CM | POA: Diagnosis not present

## 2019-06-07 DIAGNOSIS — E663 Overweight: Secondary | ICD-10-CM | POA: Diagnosis not present

## 2019-06-11 ENCOUNTER — Encounter: Payer: Self-pay | Admitting: Internal Medicine

## 2019-06-15 LAB — TESTOSTERONE, FREE AND TOTAL (INCLUDES SHBG)-(MALES)
% Free Testosterone: 2.9 %
Free Testosterone, S: 5.8 pg/mL
Sex Hormone Binding Globulin: 17.4 nmol/L — ABNORMAL LOW
Testosterone, Serum (Total): 20 ng/dL

## 2019-06-15 LAB — DHEA-SULFATE, SERUM: DHEA-Sulfate, LCMS: 474 ug/dL — ABNORMAL HIGH

## 2019-06-17 MED ORDER — METFORMIN HCL ER 750 MG PO TB24: 750.0000 mg | ORAL_TABLET | Freq: Two times a day (BID) | ORAL | 3 refills | Status: DC

## 2019-06-17 NOTE — Addendum Note (Signed)
Addended by: Philemon Kingdom on: 06/17/2019 12:09 PM   Modules accepted: Orders

## 2019-07-10 DIAGNOSIS — Z0000 Encounter for general adult medical examination without abnormal findings: Secondary | ICD-10-CM | POA: Diagnosis not present

## 2019-07-10 DIAGNOSIS — Z6827 Body mass index (BMI) 27.0-27.9, adult: Secondary | ICD-10-CM | POA: Diagnosis not present

## 2019-07-10 DIAGNOSIS — E559 Vitamin D deficiency, unspecified: Secondary | ICD-10-CM | POA: Diagnosis not present

## 2019-07-10 DIAGNOSIS — E663 Overweight: Secondary | ICD-10-CM | POA: Diagnosis not present

## 2019-08-02 ENCOUNTER — Other Ambulatory Visit: Payer: Self-pay

## 2019-08-06 ENCOUNTER — Other Ambulatory Visit: Payer: Self-pay

## 2019-08-06 ENCOUNTER — Other Ambulatory Visit (HOSPITAL_COMMUNITY)
Admission: RE | Admit: 2019-08-06 | Discharge: 2019-08-06 | Disposition: A | Discharge status: Home / Self Care | Payer: BC Managed Care – PPO | Source: Ambulatory Visit | Attending: Certified Nurse Midwife | Admitting: Certified Nurse Midwife

## 2019-08-06 ENCOUNTER — Encounter: Payer: Self-pay | Admitting: Certified Nurse Midwife

## 2019-08-06 ENCOUNTER — Ambulatory Visit (INDEPENDENT_AMBULATORY_CARE_PROVIDER_SITE_OTHER): Payer: BC Managed Care – PPO | Admitting: Certified Nurse Midwife

## 2019-08-06 VITALS — BP 118/70 | HR 68 | Temp 98.1°F | Resp 16 | Ht 63.5 in | Wt 163.0 lb

## 2019-08-06 DIAGNOSIS — Z01419 Encounter for gynecological examination (general) (routine) without abnormal findings: Secondary | ICD-10-CM

## 2019-08-06 DIAGNOSIS — Z113 Encounter for screening for infections with a predominantly sexual mode of transmission: Secondary | ICD-10-CM | POA: Insufficient documentation

## 2019-08-06 DIAGNOSIS — Z124 Encounter for screening for malignant neoplasm of cervix: Secondary | ICD-10-CM | POA: Insufficient documentation

## 2019-08-06 NOTE — Progress Notes (Signed)
23 y.o. G0P0000 Single  African American Fe here for annual exam. Periods regular, duration 4 days, 3 heavy days and then light. Cramping minimal, no medication. Contraception condoms mostly consistent. STD screening requested, not symptomatic. Was diagnosed with PCOS and has been working on dietary intake and periods have improved.  Patient's last menstrual period was 07/27/2019 (exact date).          Sexually active: Yes.    The current method of family planning is condoms most of the time.    Exercising: Yes.    some Smoker:  no  Review of Systems  Constitutional: Negative.   HENT: Negative.   Eyes: Negative.   Respiratory: Negative.   Cardiovascular: Negative.   Gastrointestinal: Negative.   Genitourinary: Negative.   Musculoskeletal: Negative.   Skin: Negative.   Neurological: Negative.   Endo/Heme/Allergies: Negative.   Psychiatric/Behavioral: Negative.     Health Maintenance: Pap:  none History of Abnormal Pap: no MMG:  none Self Breast exams: no Colonoscopy:  none BMD:   none TDaP:  2017 Shingles: no Pneumonia: no Hep C and HIV: not done Labs: if needed   reports that she has never smoked. She has never used smokeless tobacco. She reports that she does not drink alcohol or use drugs.  Past Medical History:  Diagnosis Date  . Amenorrhea   . PCOS (polycystic ovarian syndrome)     History reviewed. No pertinent surgical history.  No current outpatient medications on file.   No current facility-administered medications for this visit.    Family History  Problem Relation Age of Onset  . Diabetes Mother   . Hyperlipidemia Maternal Grandmother   . Diabetes Paternal Grandmother   . Bone cancer Paternal Grandmother   . Diabetes Paternal Grandfather     ROS:  Pertinent items are noted in HPI.  Otherwise, a comprehensive ROS was negative.  Exam:   BP 118/70   Pulse 68   Temp 98.1 F (36.7 C) (Skin)   Resp 16   Ht 5' 3.5" (1.613 m)   Wt 163 lb (73.9  kg)   LMP 07/27/2019 (Exact Date)   BMI 28.42 kg/m  Height: 5' 3.5" (161.3 cm) Ht Readings from Last 3 Encounters:  08/06/19 5' 3.5" (1.613 m)  05/14/19 5' 3.75" (1.619 m)  07/20/18 5' 3.75" (1.619 m)    General appearance: alert, cooperative and appears stated age Head: Normocephalic, without obvious abnormality, atraumatic Neck: no adenopathy, supple, symmetrical, trachea midline and thyroid no change from previous exam Lungs: clear to auscultation bilaterally Breasts: normal appearance, no masses or tenderness, No nipple retraction or dimpling, No nipple discharge or bleeding, No axillary or supraclavicular adenopathy, Taught monthly breast self examination Heart: regular rate and rhythm Abdomen: soft, non-tender; no masses,  no organomegaly Extremities: extremities normal, atraumatic, no cyanosis or edema Skin: Skin color, texture, turgor normal. No rashes or lesions Lymph nodes: Cervical, supraclavicular, and axillary nodes normal. No abnormal inguinal nodes palpated Neurologic: Grossly normal   Pelvic: External genitalia:  no lesions              Urethra:  normal appearing urethra with no masses, tenderness or lesions              Bartholin's and Skene's: normal                 Vagina: normal appearing vagina with normal color and discharge, no lesions              Cervix: no  cervical motion tenderness, no lesions and nulliparous appearance              Pap taken: Yes.   Bimanual Exam:  Uterus:  anteverted              Adnexa: normal adnexa and no mass, fullness, tenderness               Rectovaginal: Confirms               Anus:  normal appearance, no lesions  Chaperone present: yes  A:  Well Woman with normal exam  Contraception condoms, tries to be consistent  STD screening  History of PCOS  P:   Reviewed health and wellness pertinent to exam  Stressed consistent use for contraception and STD prevention  Lab: Affirm, GC,Chlamydia, Trichomonas  Will advise if  periods become irregular, continue to work on good diet/exercise  Pap smear: yes  counseled on breast self exam, STD prevention, HIV risk factors and prevention, feminine hygiene, diet and exercise  return annually or prn  An After Visit Summary was printed and given to the patient.

## 2019-08-06 NOTE — Patient Instructions (Signed)
General topics  Next pap or exam is  due in 1 year Take a Women's multivitamin Take 1200 mg. of calcium daily - prefer dietary If any concerns in interim to call back  Breast Self-Awareness Practicing breast self-awareness may pick up problems early, prevent significant medical complications, and possibly save your life. By practicing breast self-awareness, you can become familiar with how your breasts look and feel and if your breasts are changing. This allows you to notice changes early. It can also offer you some reassurance that your breast health is good. One way to learn what is normal for your breasts and whether your breasts are changing is to do a breast self-exam. If you find a lump or something that was not present in the past, it is best to contact your caregiver right away. Other findings that should be evaluated by your caregiver include nipple discharge, especially if it is bloody; skin changes or reddening; areas where the skin seems to be pulled in (retracted); or new lumps and bumps. Breast pain is seldom associated with cancer (malignancy), but should also be evaluated by a caregiver. BREAST SELF-EXAM The best time to examine your breasts is 5 7 days after your menstrual period is over.  ExitCare Patient Information 2013 [employer].   Exercise to Stay Healthy Exercise helps you become and stay healthy. EXERCISE IDEAS AND TIPS Choose exercises that:  You enjoy.  Fit into your day. You do not need to exercise really hard to be healthy. You can do exercises at a slow or medium level and stay healthy. You can:  Stretch before and after working out.  Try yoga, Pilates, or tai chi.  Lift weights.  Walk fast, swim, jog, run, climb stairs, bicycle, dance, or rollerskate.  Take aerobic classes. Exercises that burn about 150 calories:  Running 1  miles in 15 minutes.  Playing volleyball for 45 to 60 minutes.  Washing and waxing a car for 45 to 60  minutes.  Playing touch football for 45 minutes.  Walking 1  miles in 35 minutes.  Pushing a stroller 1  miles in 30 minutes.  Playing basketball for 30 minutes.  Raking leaves for 30 minutes.  Bicycling 5 miles in 30 minutes.  Walking 2 miles in 30 minutes.  Dancing for 30 minutes.  Shoveling snow for 15 minutes.  Swimming laps for 20 minutes.  Walking up stairs for 15 minutes.  Bicycling 4 miles in 15 minutes.  Gardening for 30 to 45 minutes.  Jumping rope for 15 minutes.  Washing windows or floors for 45 to 60 minutes. Document Released: 09/03/2010 Document Revised: 10/24/2011 Document Reviewed: 09/03/2010 ExitCare Patient Information 2013 [employer].   Other topics ( that may be useful information):    Sexually Transmitted Disease Sexually transmitted disease (STD) refers to any infection that is passed from person to person during sexual activity. This may happen by way of saliva, semen, blood, vaginal mucus, or urine. Common STDs include:  Gonorrhea.  Chlamydia.  Syphilis.  HIV/AIDS.  Genital herpes.  Hepatitis B and C.  Trichomonas.  Human papillomavirus (HPV).  Pubic lice. CAUSES  An STD may be spread by bacteria, virus, or parasite. A person can get an STD by:  Sexual intercourse with an infected person.  Sharing sex toys with an infected person.  Sharing needles with an infected person.  Having intimate contact with the genitals, mouth, or rectal areas of an infected person. SYMPTOMS  Some people may not have any symptoms, but   they can still pass the infection to others. Different STDs have different symptoms. Symptoms include:  Painful or bloody urination.  Pain in the pelvis, abdomen, vagina, anus, throat, or eyes.  Skin rash, itching, irritation, growths, or sores (lesions). These usually occur in the genital or anal area.  Abnormal vaginal discharge.  Penile discharge in men.  Soft, flesh-colored skin growths in the  genital or anal area.  Fever.  Pain or bleeding during sexual intercourse.  Swollen glands in the groin area.  Yellow skin and eyes (jaundice). This is seen with hepatitis. DIAGNOSIS  To make a diagnosis, your caregiver may:  Take a medical history.  Perform a physical exam.  Take a specimen (culture) to be examined.  Examine a sample of discharge under a microscope.  Perform blood test TREATMENT   Chlamydia, gonorrhea, trichomonas, and syphilis can be cured with antibiotic medicine.  Genital herpes, hepatitis, and HIV can be treated, but not cured, with prescribed medicines. The medicines will lessen the symptoms.  Genital warts from HPV can be treated with medicine or by freezing, burning (electrocautery), or surgery. Warts may come back.  HPV is a virus and cannot be cured with medicine or surgery.However, abnormal areas may be followed very closely by your caregiver and may be removed from the cervix, vagina, or vulva through office procedures or surgery. If your diagnosis is confirmed, your recent sexual partners need treatment. This is true even if they are symptom-free or have a negative culture or evaluation. They should not have sex until their caregiver says it is okay. HOME CARE INSTRUCTIONS  All sexual partners should be informed, tested, and treated for all STDs.  Take your antibiotics as directed. Finish them even if you start to feel better.  Only take over-the-counter or prescription medicines for pain, discomfort, or fever as directed by your caregiver.  Rest.  Eat a balanced diet and drink enough fluids to keep your urine clear or pale yellow.  Do not have sex until treatment is completed and you have followed up with your caregiver. STDs should be checked after treatment.  Keep all follow-up appointments, Pap tests, and blood tests as directed by your caregiver.  Only use latex condoms and water-soluble lubricants during sexual activity. Do not use  petroleum jelly or oils.  Avoid alcohol and illegal drugs.  Get vaccinated for HPV and hepatitis. If you have not received these vaccines in the past, talk to your caregiver about whether one or both might be right for you.  Avoid risky sex practices that can break the skin. The only way to avoid getting an STD is to avoid all sexual activity.Latex condoms and dental dams (for oral sex) will help lessen the risk of getting an STD, but will not completely eliminate the risk. SEEK MEDICAL CARE IF:   You have a fever.  You have any new or worsening symptoms. Document Released: 10/22/2002 Document Revised: 10/24/2011 Document Reviewed: 10/29/2010 [facility] Patient Information 2013 [employer].    Domestic Abuse You are being battered or abused if someone close to you hits, pushes, or physically hurts you in any way. You also are being abused if you are forced into activities. You are being sexually abused if you are forced to have sexual contact of any kind. You are being emotionally abused if you are made to feel worthless or if you are constantly threatened. It is important to remember that help is available. No one has the right to abuse you. PREVENTION OF FURTHER  ABUSE  Learn the warning signs of danger. This varies with situations but may include: the use of alcohol, threats, isolation from friends and family, or forced sexual contact. Leave if you feel that violence is going to occur.  If you are attacked or beaten, report it to the police so the abuse is documented. You do not have to press charges. The police can protect you while you or the attackers are leaving. Get the officer's name and badge number and a copy of the report.  Find someone you can trust and tell them what is happening to you: your caregiver, a nurse, clergy member, close friend or family member. Feeling ashamed is natural, but remember that you have done nothing wrong. No one deserves abuse. Document Released:  07/29/2000 Document Revised: 10/24/2011 Document Reviewed: 10/07/2010 ExitCare Patient Information 2013 [employer].    How Much is Too Much Alcohol? Drinking too much alcohol can cause injury, accidents, and health problems. These types of problems can include:   Car crashes.  Falls.  Family fighting (domestic violence).  Drowning.  Fights.  Injuries.  Burns.  Damage to certain organs.  Having a baby with birth defects. ONE DRINK CAN BE TOO MUCH WHEN YOU ARE:  Working.  Pregnant or breastfeeding.  Taking medicines. Ask your doctor.  Driving or planning to drive. If you or someone you know has a drinking problem, get help from a doctor.  Document Released: 05/28/2009 Document Revised: 10/24/2011 Document Reviewed: 05/28/2009 ExitCare Patient Information 2013 [employer].   Smoking Hazards Smoking cigarettes is extremely bad for your health. Tobacco smoke has over 200 known poisons in it. There are over 60 chemicals in tobacco smoke that cause cancer. Some of the chemicals found in cigarette smoke include:   Cyanide.  Benzene.  Formaldehyde.  Methanol (wood alcohol).  Acetylene (fuel used in welding torches).  Ammonia. Cigarette smoke also contains the poisonous gases nitrogen oxide and carbon monoxide.  Cigarette smokers have an increased risk of many serious medical problems and Smoking causes approximately:  90% of all lung cancer deaths in men.  80% of all lung cancer deaths in women.  90% of deaths from chronic obstructive lung disease. Compared with nonsmokers, smoking increases the risk of:  Coronary heart disease by 2 to 4 times.  Stroke by 2 to 4 times.  Men developing lung cancer by 23 times.  Women developing lung cancer by 13 times.  Dying from chronic obstructive lung diseases by 12 times.  . Smoking is the most preventable cause of death and disease in our society.  WHY IS SMOKING ADDICTIVE?  Nicotine is the chemical  agent in tobacco that is capable of causing addiction or dependence.  When you smoke and inhale, nicotine is absorbed rapidly into the bloodstream through your lungs. Nicotine absorbed through the lungs is capable of creating a powerful addiction. Both inhaled and non-inhaled nicotine may be addictive.  Addiction studies of cigarettes and spit tobacco show that addiction to nicotine occurs mainly during the teen years, when young people begin using tobacco products. WHAT ARE THE BENEFITS OF QUITTING?  There are many health benefits to quitting smoking.   Likelihood of developing cancer and heart disease decreases. Health improvements are seen almost immediately.  Blood pressure, pulse rate, and breathing patterns start returning to normal soon after quitting. QUITTING SMOKING   American Lung Association - 1-800-LUNGUSA  American Cancer Society - 1-800-ACS-2345 Document Released: 09/08/2004 Document Revised: 10/24/2011 Document Reviewed: 05/13/2009 ExitCare Patient Information 2013 [employer]Care[employer]   LL[employer].   Stress Management Stress is a state of physical or mental tension that often results from changes in your life or normal routine. Some common causes of stress are:  Death of a loved one.  Injuries or severe illnesses.  Getting fired or changing jobs.  Moving into a new home. Other causes may be:  Sexual problems.  Business or financial losses.  Taking on a large debt.  Regular conflict with someone at home or at work.  Constant tiredness from lack of sleep. It is not just bad things that are stressful. It may be stressful to:  Win the lottery.  Get married.  Buy a new car. The amount of stress that can be easily tolerated varies from person to person. Changes generally cause stress, regardless of the types of change. Too much stress can affect your health. It may lead to physical or emotional problems. Too little stress (boredom) may also become stressful. SUGGESTIONS TO  REDUCE STRESS:  Talk things over with your family and friends. It often is helpful to share your concerns and worries. If you feel your problem is serious, you may want to get help from a professional counselor.  Consider your problems one at a time instead of lumping them all together. Trying to take care of everything at once may seem impossible. List all the things you need to do and then start with the most important one. Set a goal to accomplish 2 or 3 things each day. If you expect to do too many in a single day you will naturally fail, causing you to feel even more stressed.  Do not use alcohol or drugs to relieve stress. Although you may feel better for a short time, they do not remove the problems that caused the stress. They can also be habit forming.  Exercise regularly - at least 3 times per week. Physical exercise can help to relieve that "uptight" feeling and will relax you.  The shortest distance between despair and hope is often a good night's sleep.  Go to bed and get up on time allowing yourself time for appointments without being rushed.  Take a short "time-out" period from any stressful situation that occurs during the day. Close your eyes and take some deep breaths. Starting with the muscles in your face, tense them, hold it for a few seconds, then relax. Repeat this with the muscles in your neck, shoulders, hand, stomach, back and legs.  Take good care of yourself. Eat a balanced diet and get plenty of rest.  Schedule time for having fun. Take a break from your daily routine to relax. HOME CARE INSTRUCTIONS   Call if you feel overwhelmed by your problems and feel you can no longer manage them on your own.  Return immediately if you feel like hurting yourself or someone else. Document Released: 01/25/2001 Document Revised: 10/24/2011 Document Reviewed: 09/17/2007 ExitCare Patient Information 2013 [employer].   

## 2019-08-07 ENCOUNTER — Other Ambulatory Visit: Payer: Self-pay | Admitting: *Deleted

## 2019-08-07 ENCOUNTER — Telehealth: Payer: Self-pay | Admitting: Certified Nurse Midwife

## 2019-08-07 LAB — CYTOLOGY - PAP
Chlamydia: NEGATIVE
Comment: NEGATIVE
Comment: NORMAL
Diagnosis: NEGATIVE
Neisseria Gonorrhea: NEGATIVE

## 2019-08-07 LAB — VAGINITIS/VAGINOSIS, DNA PROBE
Candida Species: NEGATIVE
Gardnerella vaginalis: POSITIVE — AB
Trichomonas vaginosis: NEGATIVE

## 2019-08-07 MED ORDER — METRONIDAZOLE 500 MG PO TABS: 500.0000 mg | ORAL_TABLET | Freq: Two times a day (BID) | ORAL | 0 refills | Status: DC

## 2019-08-07 NOTE — Telephone Encounter (Signed)
Routing to Melvia Heaps, CNM to review 08/06/19 affirm results and advise.

## 2019-08-07 NOTE — Telephone Encounter (Signed)
Patient would like a call to go over lab results

## 2019-10-28 ENCOUNTER — Encounter: Payer: Self-pay | Admitting: Certified Nurse Midwife

## 2019-10-30 ENCOUNTER — Encounter: Payer: Self-pay | Admitting: Certified Nurse Midwife

## 2019-11-12 ENCOUNTER — Ambulatory Visit: Payer: BC Managed Care – PPO | Admitting: Internal Medicine

## 2019-11-12 DIAGNOSIS — Z0289 Encounter for other administrative examinations: Secondary | ICD-10-CM

## 2020-03-13 ENCOUNTER — Emergency Department (HOSPITAL_COMMUNITY)
Admission: EM | Admit: 2020-03-13 | Discharge: 2020-03-13 | Discharge status: Against Medical Advice | Payer: BC Managed Care – PPO

## 2020-03-13 ENCOUNTER — Other Ambulatory Visit: Payer: Self-pay

## 2020-03-13 NOTE — ED Notes (Signed)
Pt called for triage x3 with no response 

## 2020-03-18 ENCOUNTER — Other Ambulatory Visit: Payer: Self-pay

## 2020-03-18 ENCOUNTER — Encounter: Payer: Self-pay | Admitting: Obstetrics and Gynecology

## 2020-03-18 ENCOUNTER — Other Ambulatory Visit (HOSPITAL_COMMUNITY)
Admission: RE | Admit: 2020-03-18 | Discharge: 2020-03-18 | Disposition: A | Discharge status: Home / Self Care | Payer: Self-pay | Source: Ambulatory Visit | Attending: Obstetrics and Gynecology | Admitting: Obstetrics and Gynecology

## 2020-03-18 ENCOUNTER — Ambulatory Visit (INDEPENDENT_AMBULATORY_CARE_PROVIDER_SITE_OTHER): Payer: BC Managed Care – PPO | Admitting: Obstetrics and Gynecology

## 2020-03-18 VITALS — BP 118/76 | HR 80 | Ht 63.5 in | Wt 163.4 lb

## 2020-03-18 DIAGNOSIS — Z719 Counseling, unspecified: Secondary | ICD-10-CM | POA: Diagnosis not present

## 2020-03-18 DIAGNOSIS — Z113 Encounter for screening for infections with a predominantly sexual mode of transmission: Secondary | ICD-10-CM | POA: Insufficient documentation

## 2020-03-18 DIAGNOSIS — L739 Follicular disorder, unspecified: Secondary | ICD-10-CM

## 2020-03-18 NOTE — Patient Instructions (Signed)

## 2020-03-18 NOTE — Progress Notes (Signed)
GYNECOLOGY  VISIT   HPI: 24 y.o.   Single  African American  female   G0P0000 with Patient's last menstrual period was 03/08/2020 (exact date).   here for STD testing.   She wants full testing for STDs including HSV.  Notes some lumps on her vulva, not sure it is from shaving.  No known exposure to anything in particular.  No abnormal uterine bleeding, pelvic pain, or discharge.   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   Not vaccinated against Covid.   GYNECOLOGIC HISTORY: Patient's last menstrual period was 03/08/2020 (exact date). Contraception: Condoms sometimes Menopausal hormone therapy:  n/a Last mammogram:  n/a Last pap smear: 08-06-19 Neg         OB History    Gravida  0   Para  0   Term  0   Preterm  0   AB  0   Living  0     SAB  0   TAB  0   Ectopic  0   Multiple  0   Live Births  0              Patient Active Problem List   Diagnosis Date Noted  . PCOS (polycystic ovarian syndrome) 05/14/2019    Past Medical History:  Diagnosis Date  . Amenorrhea   . PCOS (polycystic ovarian syndrome)     History reviewed. No pertinent surgical history.  No current outpatient medications on file.   No current facility-administered medications for this visit.     ALLERGIES: Patient has no known allergies.  Family History  Problem Relation Age of Onset  . Diabetes Mother   . Hyperlipidemia Maternal Grandmother   . Diabetes Paternal Grandmother   . Bone cancer Paternal Grandmother   . Diabetes Paternal Grandfather     Social History   Socioeconomic History  . Marital status: Single    Spouse name: Not on file  . Number of children: Not on file  . Years of education: Not on file  . Highest education level: Not on file  Occupational History  . Not on file  Tobacco Use  . Smoking status: Never Smoker  . Smokeless tobacco: Never Used  Substance and Sexual Activity  . Alcohol use: No  . Drug use: No  . Sexual activity: Yes    Partners: Male     Birth control/protection: Condom  Other Topics Concern  . Not on file  Social History Narrative  . Not on file   Social Determinants of Health   Financial Resource Strain:   . Difficulty of Paying Living Expenses:   Food Insecurity:   . Worried About Programme researcher, broadcasting/film/video in the Last Year:   . Barista in the Last Year:   Transportation Needs:   . Freight forwarder (Medical):   Marland Kitchen Lack of Transportation (Non-Medical):   Physical Activity:   . Days of Exercise per Week:   . Minutes of Exercise per Session:   Stress:   . Feeling of Stress :   Social Connections:   . Frequency of Communication with Friends and Family:   . Frequency of Social Gatherings with Friends and Family:   . Attends Religious Services:   . Active Member of Clubs or Organizations:   . Attends Banker Meetings:   Marland Kitchen Marital Status:   Intimate Partner Violence:   . Fear of Current or Ex-Partner:   . Emotionally Abused:   Marland Kitchen Physically Abused:   .  Sexually Abused:     Review of Systems  All other systems reviewed and are negative.   PHYSICAL EXAMINATION:    BP 118/76   Pulse 80   Ht 5' 3.5" (1.613 m)   Wt 163 lb 6.4 oz (74.1 kg)   LMP 03/08/2020 (Exact Date)   BMI 28.49 kg/m     General appearance: alert, cooperative and appears stated age   Pelvic: External genitalia:  Follicular irritation.  (Shaves vulva.)              Urethra:  normal appearing urethra with no masses, tenderness or lesions              Bartholins and Skenes: normal                 Vagina: normal appearing vagina with normal color and discharge, no lesions              Cervix: no lesions                Bimanual Exam:  Uterus:  normal size, contour, position, consistency, mobility, non-tender              Adnexa: no mass, fullness, tenderness              Chaperone was present for exam.  ASSESSMENT  STD screening.  Folliculitis.  Health education.   PLAN  STD screening.  We discussed female and  female condom use.  We discussed Covid vaccination.  Schedule annual exam.

## 2020-03-19 LAB — CERVICOVAGINAL ANCILLARY ONLY
Chlamydia: NEGATIVE
Comment: NEGATIVE
Comment: NEGATIVE
Comment: NORMAL
Neisseria Gonorrhea: NEGATIVE
Trichomonas: NEGATIVE

## 2020-03-19 LAB — HEP, RPR, HIV PANEL
HIV Screen 4th Generation wRfx: NONREACTIVE
Hepatitis B Surface Ag: NEGATIVE
RPR Ser Ql: NONREACTIVE

## 2020-03-19 LAB — HSV(HERPES SIMPLEX VRS) I + II AB-IGG
HSV 1 Glycoprotein G Ab, IgG: <0.91 index (ref 0.00–0.90)
HSV 2 IgG, Type Spec: <0.91 index (ref 0.00–0.90)

## 2020-03-19 LAB — HEPATITIS C ANTIBODY: Hep C Virus Ab: <0.1 s/co ratio (ref 0.0–0.9)

## 2021-08-24 ENCOUNTER — Inpatient Hospital Stay (HOSPITAL_COMMUNITY)
Admission: AD | Admit: 2021-08-24 | Payer: Medicaid Other | Source: Home / Self Care | Admitting: Obstetrics and Gynecology

## 2022-05-13 ENCOUNTER — Ambulatory Visit: Admit: 2022-05-13 | Discharge status: Absent without leave | Payer: Medicaid Other

## 2022-10-06 ENCOUNTER — Encounter: Payer: Self-pay | Admitting: Adult Health

## 2022-10-06 ENCOUNTER — Ambulatory Visit (INDEPENDENT_AMBULATORY_CARE_PROVIDER_SITE_OTHER): Payer: Medicaid Other | Admitting: Adult Health

## 2022-10-06 VITALS — BP 134/86 | HR 88 | Ht 63.0 in | Wt 172.5 lb

## 2022-10-06 DIAGNOSIS — O09299 Supervision of pregnancy with other poor reproductive or obstetric history, unspecified trimester: Secondary | ICD-10-CM | POA: Diagnosis not present

## 2022-10-06 DIAGNOSIS — O3680X0 Pregnancy with inconclusive fetal viability, not applicable or unspecified: Secondary | ICD-10-CM | POA: Diagnosis not present

## 2022-10-06 DIAGNOSIS — Z3A01 Less than 8 weeks gestation of pregnancy: Secondary | ICD-10-CM | POA: Diagnosis not present

## 2022-10-06 DIAGNOSIS — Z3201 Encounter for pregnancy test, result positive: Secondary | ICD-10-CM | POA: Diagnosis not present

## 2022-10-06 DIAGNOSIS — Z8632 Personal history of gestational diabetes: Secondary | ICD-10-CM

## 2022-10-06 LAB — POCT URINE PREGNANCY: Preg Test, Ur: POSITIVE — AB

## 2022-10-06 NOTE — Progress Notes (Signed)
  Subjective:     Patient ID: Mary Khan, female   DOB: [birth year], 27 y.o.   MRN: [id number]  HPI Mary Khan is a 27 year old black female,married, G2P1 in for UPT, has missed period and had 3+HPTs. Her son is 66 months old and still breast feeding. She had gestational diabetes with him and growth restriction and was induced at 37-38 weeks, was being cared for in [place].  She lives in [place].    Review of Systems Missed period 3+HPTs Reviewed past medical,surgical, social and family history. Reviewed medications and allergies.     Objective:   Physical Exam BP 134/86 (BP Location: Left Arm, Patient Position: Sitting, Cuff Size: Normal)   Pulse 88   Ht 5' 3"$  (1.6 m)   Wt 172 lb 8 oz (78.2 kg)   LMP 08/26/2022   Breastfeeding Yes   BMI 30.56 kg/m  UPT +, about 5+6 weeks by LPM with EDD 06/02/23.Skin warm and dry. Neck: mid line trachea, normal thyroid, good ROM, no lymphadenopathy noted. Lungs: clear to ausculation bilaterally. Cardiovascular: regular rate and rhythm.    AA is 0 Fall risk is low    10/06/2022    2:56 PM  Depression screen PHQ 2/9  Decreased Interest 0  Down, Depressed, Hopeless 0  PHQ - 2 Score 0  Altered sleeping 0  Tired, decreased energy 0  Change in appetite 0  Feeling bad or failure about yourself  0  Trouble concentrating 0  Moving slowly or fidgety/restless 0  Suicidal thoughts 0  PHQ-9 Score 0       10/06/2022    2:56 PM  GAD 7 : Generalized Anxiety Score  Nervous, Anxious, on Edge 0  Control/stop worrying 0  Worry too much - different things 0  Trouble relaxing 0  Restless 0  Easily annoyed or irritable 0  Afraid - awful might happen 0  Total GAD 7 Score 0      Upstream - 10/06/22 1500       Pregnancy Intention Screening   Does the patient want to become pregnant in the next year? N/A    Does the patient's partner want to become pregnant in the next year? N/A    Would the patient like to discuss contraceptive options today? No       Contraception Wrap Up   Current Method Pregnant/Seeking Pregnancy    End Method Pregnant/Seeking Pregnancy    Contraception Counseling Provided No             Assessment:     1. Pregnancy examination or test, positive result - POCT urine pregnancy  2. Less than [redacted] weeks gestation of pregnancy Taking prenatal gummies   3. Encounter to determine fetal viability of pregnancy, single or unspecified fetus Dating [place] in about 3 weeks  - US OB Comp Less 14 Wks; Future  4. History of gestational diabetes in prior pregnancy, currently pregnant     Plan:    Dating [place] in about 3 weeks  Can breastfeed but starting weaning was going to stop at 1 year any way  Review OB packet

## 2022-10-13 ENCOUNTER — Inpatient Hospital Stay (HOSPITAL_COMMUNITY): Payer: Medicaid Other

## 2022-10-13 ENCOUNTER — Inpatient Hospital Stay (HOSPITAL_COMMUNITY)
Admission: AD | Admit: 2022-10-13 | Discharge: 2022-10-13 | Disposition: A | Discharge status: Home / Self Care | Payer: Medicaid Other | Attending: Obstetrics and Gynecology | Admitting: Obstetrics and Gynecology

## 2022-10-13 ENCOUNTER — Encounter (HOSPITAL_COMMUNITY): Payer: Self-pay | Admitting: *Deleted

## 2022-10-13 DIAGNOSIS — O23591 Infection of other part of genital tract in pregnancy, first trimester: Secondary | ICD-10-CM | POA: Insufficient documentation

## 2022-10-13 DIAGNOSIS — Z3A01 Less than 8 weeks gestation of pregnancy: Secondary | ICD-10-CM | POA: Diagnosis not present

## 2022-10-13 DIAGNOSIS — N939 Abnormal uterine and vaginal bleeding, unspecified: Secondary | ICD-10-CM

## 2022-10-13 DIAGNOSIS — O26851 Spotting complicating pregnancy, first trimester: Secondary | ICD-10-CM | POA: Diagnosis present

## 2022-10-13 DIAGNOSIS — B9689 Other specified bacterial agents as the cause of diseases classified elsewhere: Secondary | ICD-10-CM | POA: Diagnosis not present

## 2022-10-13 DIAGNOSIS — N760 Acute vaginitis: Secondary | ICD-10-CM | POA: Diagnosis not present

## 2022-10-13 LAB — URINALYSIS, ROUTINE W REFLEX MICROSCOPIC
Bilirubin Urine: NEGATIVE
Glucose, UA: NEGATIVE mg/dL
Ketones, ur: >80 mg/dL — AB
Nitrite: NEGATIVE
Protein, ur: NEGATIVE mg/dL
Specific Gravity, Urine: 1.015 (ref 1.005–1.030)
pH: 6.5 (ref 5.0–8.0)

## 2022-10-13 LAB — CBC
HCT: 34.7 % — ABNORMAL LOW (ref 36.0–46.0)
Hemoglobin: 11.1 g/dL — ABNORMAL LOW (ref 12.0–15.0)
MCH: 23.5 pg — ABNORMAL LOW (ref 26.0–34.0)
MCHC: 32 g/dL (ref 30.0–36.0)
MCV: 73.5 fL — ABNORMAL LOW (ref 80.0–100.0)
Platelets: 322 K/uL (ref 150–400)
RBC: 4.72 MIL/uL (ref 3.87–5.11)
RDW: 14.2 % (ref 11.5–15.5)
WBC: 7.2 K/uL (ref 4.0–10.5)
nRBC: 0 % (ref 0.0–0.2)

## 2022-10-13 LAB — WET PREP, GENITAL
Sperm: NONE SEEN
Trich, Wet Prep: NONE SEEN
WBC, Wet Prep HPF POC: >=10 — AB (ref <10)
Yeast Wet Prep HPF POC: NONE SEEN

## 2022-10-13 LAB — ABO/RH: ABO/RH(D): B POS

## 2022-10-13 LAB — URINALYSIS, MICROSCOPIC (REFLEX)

## 2022-10-13 LAB — HCG, QUANTITATIVE, PREGNANCY: hCG, Beta Chain, Quant, S: 24647 m[IU]/mL — ABNORMAL HIGH (ref <5)

## 2022-10-13 MED ORDER — METRONIDAZOLE 500 MG PO TABS: 500.0000 mg | ORAL_TABLET | Freq: Two times a day (BID) | ORAL | 0 refills | Status: DC

## 2022-10-13 NOTE — MAU Note (Signed)
Gailen Shelter CNM in Triage to see pt and discuss test results and d/c plan

## 2022-10-13 NOTE — Progress Notes (Signed)
Written and verbal d/c instructions given and understanding voiced. 

## 2022-10-13 NOTE — MAU Note (Addendum)
.  Mary Khan is a 27 y.o. at 38w6dhere in MAU reporting spotting for 3 days. Today had episode of nausea and dizziness and spotting was bright pink instead of the brown it had been. Nausea and dizziness subsided after she laid down for 330ms. No pain LMP: 08/26/22 Onset of complaint: 3 days Pain score: 0 Vitals:   10/13/22 1924 10/13/22 1926  BP:  135/72  Pulse: 93   Resp: 17   Temp: 98.9 F (37.2 C)   SpO2: 100%      FHT:n/a Lab orders placed from triage:  u/a

## 2022-10-13 NOTE — MAU Provider Note (Signed)
History     CSN: [id number]  Arrival date and time: 10/13/22 1857   Event Date/Time   First Provider Initiated Contact with Patient 10/13/22 2150      Chief Complaint  Patient presents with   Vaginal Bleeding   Mary Khan , a  27 y.o. G2P1001 at 51w6dpresents to MAU with complaints of brown spotting that started 3 days ago. Patient reports that today bleeding became more pink tinged and she became worried. She denies bright red bleeding wearing a pad or passing clots. She denies abnormal vaginal discharge prior to this and denies urinary symptoms. She also denies abdominal pain.     Vaginal Bleeding The patient's pertinent negatives include no pelvic pain or vaginal discharge. Pertinent negatives include no abdominal pain, back pain, chills, constipation, diarrhea, dysuria, fever, headaches, nausea or vomiting.    OB History     Gravida  2   Para  1   Term  1   Preterm  0   AB  0   Living  1      SAB  0   IAB  0   Ectopic  0   Multiple  0   Live Births  1           Past Medical History:  Diagnosis Date   Amenorrhea    PCOS (polycystic ovarian syndrome)     No past surgical history on file.  Family History  Problem Relation Age of Onset   Diabetes Paternal Grandfather    Diabetes Paternal Grandmother    Bone cancer Paternal Grandmother    Hyperlipidemia Maternal Grandmother    Anemia Father    Diabetes Mother     Social History   Tobacco Use   Smoking status: Never   Smokeless tobacco: Never  Vaping Use   Vaping Use: Never used  Substance Use Topics   Alcohol use: No   Drug use: No    Allergies: No Known Allergies  Medications Prior to Admission  Medication Sig Dispense Refill Last Dose   Prenatal Vit-Fe Fumarate-FA (PRENATAL VITAMIN PO) Take by mouth.       Review of Systems  Constitutional:  Negative for chills, fatigue and fever.  Eyes:  Negative for pain and visual disturbance.  Respiratory:  Negative for apnea,  shortness of breath and wheezing.   Cardiovascular:  Negative for chest pain and palpitations.  Gastrointestinal:  Negative for abdominal pain, constipation, diarrhea, nausea and vomiting.  Genitourinary:  Positive for vaginal bleeding. Negative for difficulty urinating, dysuria, pelvic pain, vaginal discharge and vaginal pain.  Musculoskeletal:  Negative for back pain.  Neurological:  Negative for seizures, weakness and headaches.  Psychiatric/Behavioral:  Negative for suicidal ideas.    Physical Exam   Blood pressure 139/87, pulse 96, temperature 98.9 F (37.2 C), resp. rate 17, height '5\' 3"'$  (1.6 m), weight 77.1 kg, last menstrual period 08/26/2022, SpO2 100 %, currently breastfeeding.  Physical Exam Vitals and nursing note reviewed.  Constitutional:      General: She is not in acute distress.    Appearance: Normal appearance.  HENT:     Head: Normocephalic.  Pulmonary:     Effort: Pulmonary effort is normal.  Musculoskeletal:        General: Normal range of motion.     Cervical back: Normal range of motion.  Skin:    General: Skin is warm and dry.  Neurological:     Mental Status: She is alert and oriented to person, place,  and time.  Psychiatric:        Mood and Affect: Mood normal.     MAU Course  Procedures Orders Placed This Encounter  Procedures   Wet prep, genital   US OB LESS THAN 14 WEEKS WITH OB TRANSVAGINAL   Urinalysis, Routine w reflex microscopic -Urine, Clean Catch   CBC   hCG, quantitative, pregnancy   Urinalysis, Microscopic (reflex)   Diet NPO time specified   ABO/Rh   Discharge patient    MDM Results for orders placed or performed during the hospital encounter of 10/13/22 (from the past 24 hour(s))  CBC     Status: Abnormal   Collection Time: 10/13/22  7:58 PM  Result Value Ref Range   WBC 7.2 4.0 - 10.5 K/uL   RBC 4.72 3.87 - 5.11 MIL/uL   Hemoglobin 11.1 (L) 12.0 - 15.0 g/dL   HCT 34.7 (L) 36.0 - 46.0 %   MCV 73.5 (L) 80.0 - 100.0 fL    MCH 23.5 (L) 26.0 - 34.0 pg   MCHC 32.0 30.0 - 36.0 g/dL   RDW 14.2 11.5 - 15.5 %   Platelets 322 150 - 400 K/uL   nRBC 0.0 0.0 - 0.2 %  hCG, quantitative, pregnancy     Status: Abnormal   Collection Time: 10/13/22  7:58 PM  Result Value Ref Range   hCG, Beta Chain, Quant, S 24,647 (H) <5 mIU/mL  ABO/Rh     Status: None   Collection Time: 10/13/22  8:06 PM  Result Value Ref Range   ABO/RH(D) B POS    No rh immune globuloin      NOT A RH IMMUNE GLOBULIN CANDIDATE, PT RH POSITIVE Performed at [facility], [place] [address], [place], [place] [address]   Urinalysis, Routine w reflex microscopic -Urine, Clean Catch     Status: Abnormal   Collection Time: 10/13/22  8:11 PM  Result Value Ref Range   Color, Urine YELLOW YELLOW   APPearance CLEAR CLEAR   Specific Gravity, Urine 1.015 1.005 - 1.030   pH 6.5 5.0 - 8.0   Glucose, UA NEGATIVE NEGATIVE mg/dL   Hgb urine dipstick LARGE (A) NEGATIVE   Bilirubin Urine NEGATIVE NEGATIVE   Ketones, ur >80 (A) NEGATIVE mg/dL   Protein, ur NEGATIVE NEGATIVE mg/dL   Nitrite NEGATIVE NEGATIVE   Leukocytes,Ua TRACE (A) NEGATIVE  Urinalysis, Microscopic (reflex)     Status: Abnormal   Collection Time: 10/13/22  8:11 PM  Result Value Ref Range   RBC / HPF 0-5 0 - 5 RBC/hpf   WBC, UA 0-5 0 - 5 WBC/hpf   Bacteria, UA RARE (A) NONE SEEN   Squamous Epithelial / HPF 6-10 0 - 5 /HPF   Mucus PRESENT   Wet prep, genital     Status: Abnormal   Collection Time: 10/13/22  8:41 PM   Specimen: PATH Cytology Cervicovaginal Ancillary Only  Result Value Ref Range   Yeast Wet Prep HPF POC NONE SEEN NONE SEEN   Trich, Wet Prep NONE SEEN NONE SEEN   Clue Cells Wet Prep HPF POC PRESENT (A) NONE SEEN   WBC, Wet Prep HPF POC >=10 (A) <10   Sperm NONE SEEN    US OB LESS THAN 14 WEEKS WITH OB TRANSVAGINAL  Result Date: 10/13/2022 CLINICAL DATA:  Spotting for 3 days. Beta HCG and process. LMP 08/26/2022. GA by LMP 6 weeks 6 days. EXAM: OBSTETRIC <14 WK [place] AND  TRANSVAGINAL OB US TECHNIQUE: Both transabdominal and  transvaginal ultrasound examinations were performed for complete evaluation of the gestation as well as the maternal uterus, adnexal regions, and pelvic cul-de-sac. Transvaginal technique was performed to assess early pregnancy. COMPARISON:  None Available. FINDINGS: Intrauterine gestational sac: Single Yolk sac:  Visualized. Embryo:  Visualized. Cardiac Activity: Visualized. Heart Rate: 162 bpm CRL:  4.9 mm   6 w   1 d                  [place] EDC: 06/07/2023 Subchorionic hemorrhage:  None visualized. Maternal uterus/adnexae: 3.4 cm anechoic simple cyst in the left ovary. No follow-up recommended. Normal right ovary. Trace free fluid in the pelvis. IMPRESSION: Single intrauterine gestation with crown-rump length of 4.9 mm corresponding to a gestational age of [redacted] weeks 1 day. Heart rate 162 beats per minute. Electronically Signed   By: Placido Sou M.D.   On: 10/13/2022 21:10    - Wet prep positive for BV  - UA positive for 80 of ketones and reflexed to culture for Leuks and ketones.  - quant >24000 - [place] results revealed a single living IUP with cardiac activity if 162 beats per min.  - Plan for discharge.    Assessment and Plan   1. Vaginal spotting   2. [redacted] weeks gestation of pregnancy   3. BV (bacterial vaginosis)    - Reviewed results of BV with patient and recommendation for Metronidazole.  - Meds sent to outpatient pharmacy for pick up.  - Also discussed ketones in UA and the recommendation to increase fluid intake.  - Patient verbalized understanding  - Reviewed that vaginal spotting can be very common in early pregnancy.  - Discussed worsening signs and return precautions.  - Patient discharged home in stable condition and may return to MAU as needed.   Jacquiline Doe, MSN CNM  10/13/2022, 9:50 PM

## 2022-10-14 LAB — GC/CHLAMYDIA PROBE AMP (~~LOC~~) NOT AT ARMC
Chlamydia: NEGATIVE
Comment: NEGATIVE
Comment: NORMAL
Neisseria Gonorrhea: NEGATIVE

## 2022-10-27 ENCOUNTER — Other Ambulatory Visit: Payer: Medicaid Other

## 2023-05-25 HISTORY — PX: OTHER SURGICAL HISTORY: SHX169

## 2023-08-03 ENCOUNTER — Other Ambulatory Visit: Payer: Self-pay

## 2023-08-03 DIAGNOSIS — R509 Fever, unspecified: Secondary | ICD-10-CM | POA: Diagnosis not present

## 2023-08-03 DIAGNOSIS — R000 Tachycardia, unspecified: Secondary | ICD-10-CM | POA: Insufficient documentation

## 2023-08-03 DIAGNOSIS — R0602 Shortness of breath: Secondary | ICD-10-CM | POA: Diagnosis not present

## 2023-08-03 DIAGNOSIS — Z1152 Encounter for screening for COVID-19: Secondary | ICD-10-CM | POA: Insufficient documentation

## 2023-08-03 DIAGNOSIS — R079 Chest pain, unspecified: Secondary | ICD-10-CM | POA: Diagnosis not present

## 2023-08-03 DIAGNOSIS — R002 Palpitations: Secondary | ICD-10-CM | POA: Diagnosis not present

## 2023-08-03 DIAGNOSIS — R110 Nausea: Secondary | ICD-10-CM | POA: Insufficient documentation

## 2023-08-03 DIAGNOSIS — R519 Headache, unspecified: Secondary | ICD-10-CM | POA: Diagnosis not present

## 2023-08-03 DIAGNOSIS — R197 Diarrhea, unspecified: Secondary | ICD-10-CM | POA: Insufficient documentation

## 2023-08-03 NOTE — ED Triage Notes (Signed)
Pt woke up with diarrhea, headach, nausea, fever. Temp at home 101.8, did not take tylenol. Then she noticed her HR was elevated. Pt feels tightness in chest and SOB. PT denies sick contacts, sore throat, cough

## 2023-08-04 ENCOUNTER — Emergency Department (HOSPITAL_BASED_OUTPATIENT_CLINIC_OR_DEPARTMENT_OTHER): Payer: Medicaid Other

## 2023-08-04 ENCOUNTER — Encounter (HOSPITAL_BASED_OUTPATIENT_CLINIC_OR_DEPARTMENT_OTHER): Payer: Self-pay

## 2023-08-04 ENCOUNTER — Emergency Department (HOSPITAL_BASED_OUTPATIENT_CLINIC_OR_DEPARTMENT_OTHER)
Admission: EM | Admit: 2023-08-04 | Discharge: 2023-08-04 | Disposition: A | Discharge status: Home / Self Care | Payer: Medicaid Other | Attending: Emergency Medicine | Admitting: Emergency Medicine

## 2023-08-04 ENCOUNTER — Other Ambulatory Visit: Payer: Self-pay

## 2023-08-04 DIAGNOSIS — R509 Fever, unspecified: Secondary | ICD-10-CM

## 2023-08-04 DIAGNOSIS — R000 Tachycardia, unspecified: Secondary | ICD-10-CM

## 2023-08-04 LAB — URINALYSIS, ROUTINE W REFLEX MICROSCOPIC
Bilirubin Urine: NEGATIVE
Glucose, UA: NEGATIVE mg/dL
Ketones, ur: 15 mg/dL — AB
Nitrite: NEGATIVE
Protein, ur: 30 mg/dL — AB
Specific Gravity, Urine: 1.031 — ABNORMAL HIGH (ref 1.005–1.030)
pH: 6 (ref 5.0–8.0)

## 2023-08-04 LAB — URINALYSIS, W/ REFLEX TO CULTURE (INFECTION SUSPECTED)
Bilirubin Urine: NEGATIVE
Glucose, UA: NEGATIVE mg/dL
Ketones, ur: NEGATIVE mg/dL
Nitrite: NEGATIVE
Protein, ur: NEGATIVE mg/dL
Specific Gravity, Urine: 1.018 (ref 1.005–1.030)
pH: 6 (ref 5.0–8.0)

## 2023-08-04 LAB — PREGNANCY, URINE: Preg Test, Ur: NEGATIVE

## 2023-08-04 LAB — CBC
HCT: 40.8 % (ref 36.0–46.0)
Hemoglobin: 12.9 g/dL (ref 12.0–15.0)
MCH: 23.6 pg — ABNORMAL LOW (ref 26.0–34.0)
MCHC: 31.6 g/dL (ref 30.0–36.0)
MCV: 74.6 fL — ABNORMAL LOW (ref 80.0–100.0)
Platelets: 191 10*3/uL (ref 150–400)
RBC: 5.47 MIL/uL — ABNORMAL HIGH (ref 3.87–5.11)
RDW: 13.7 % (ref 11.5–15.5)
WBC: 8.3 10*3/uL (ref 4.0–10.5)
nRBC: 0 % (ref 0.0–0.2)

## 2023-08-04 LAB — COMPREHENSIVE METABOLIC PANEL
ALT: 41 U/L (ref 0–44)
AST: 24 U/L (ref 15–41)
Albumin: 4.8 g/dL (ref 3.5–5.0)
Alkaline Phosphatase: 86 U/L (ref 38–126)
Anion gap: 10 (ref 5–15)
BUN: 7 mg/dL (ref 6–20)
CO2: 27 mmol/L (ref 22–32)
Calcium: 9.5 mg/dL (ref 8.9–10.3)
Chloride: 103 mmol/L (ref 98–111)
Creatinine, Ser: 0.91 mg/dL (ref 0.44–1.00)
GFR, Estimated: >60 mL/min (ref >60)
Glucose, Bld: 93 mg/dL (ref 70–99)
Potassium: 3.4 mmol/L — ABNORMAL LOW (ref 3.5–5.1)
Sodium: 140 mmol/L (ref 135–145)
Total Bilirubin: 0.5 mg/dL (ref <1.2)
Total Protein: 8.1 g/dL (ref 6.5–8.1)

## 2023-08-04 LAB — SARS CORONAVIRUS 2 BY RT PCR: SARS Coronavirus 2 by RT PCR: NEGATIVE

## 2023-08-04 LAB — LIPASE, BLOOD: Lipase: 40 U/L (ref 11–51)

## 2023-08-04 MED ORDER — ONDANSETRON 4 MG PO TBDP
8.0000 mg | ORAL_TABLET | Freq: Once | ORAL | Status: DC
  Filled 2023-08-04: qty 2

## 2023-08-04 MED ORDER — ACETAMINOPHEN 325 MG PO TABS
650.0000 mg | ORAL_TABLET | Freq: Four times a day (QID) | ORAL | Status: DC | PRN
  Administered 2023-08-04: 650 mg via ORAL

## 2023-08-04 MED ORDER — ACETAMINOPHEN 325 MG PO TABS
ORAL_TABLET | ORAL | Status: AC
  Filled 2023-08-04: qty 2

## 2023-08-05 NOTE — ED Provider Notes (Signed)
[facility] AT [facility] Provider Note   CSN: [id number] Arrival date & time: 08/03/23  2352     History  No chief complaint on file.   Mary Khan is a 27 y.o. female.  27 yo F here with full symptoms.  She has had diarrhea, headache, nausea, fever and tachycardia with palpitations.  She has had some chest pain shortness of breath associated that as well.  She denies any sick contacts, suspicious food intake or other respiratory symptoms.        Home Medications Prior to Admission medications   Medication Sig Start Date End Date Taking? Authorizing Provider  metroNIDAZOLE (FLAGYL) 500 MG tablet Take 1 tablet (500 mg total) by mouth 2 (two) times daily. 10/13/22   Carlynn Herald, CNM  Prenatal Vit-Fe Fumarate-FA (PRENATAL VITAMIN PO) Take by mouth.    [provider]      Allergies    Patient has no known allergies.    Review of Systems   Review of Systems  Physical Exam Updated Vital Signs BP (!) 128/90   Pulse 95   Temp 98.3 F (36.8 C) (Oral)   Resp 20   Ht 5\' 3"  (1.6 m)   Wt 77.1 kg   LMP 08/26/2022   SpO2 98%   Breastfeeding Yes   BMI 30.11 kg/m  Physical Exam Vitals and nursing note reviewed.  Constitutional:      Appearance: She is well-developed.  HENT:     Head: Normocephalic and atraumatic.     Nose: No congestion or rhinorrhea.     Mouth/Throat:     Mouth: Mucous membranes are moist.  Cardiovascular:     Rate and Rhythm: Regular rhythm. Tachycardia present.  Pulmonary:     Effort: No respiratory distress.     Breath sounds: No stridor.  Abdominal:     General: There is no distension.     Tenderness: There is no abdominal tenderness.  Musculoskeletal:        General: No swelling or tenderness. Normal range of motion.     Cervical back: Normal range of motion.  Skin:    General: Skin is warm and dry.  Neurological:     General: No focal deficit present.     Mental Status: She is alert.      ED Results / Procedures / Treatments   Labs (all labs ordered are listed, but only abnormal results are displayed) Labs Reviewed  COMPREHENSIVE METABOLIC PANEL - Abnormal; Notable for the following components:      Result Value   Potassium 3.4 (*)    All other components within normal limits  CBC - Abnormal; Notable for the following components:   RBC 5.47 (*)    MCV 74.6 (*)    MCH 23.6 (*)    All other components within normal limits  URINALYSIS, W/ REFLEX TO CULTURE (INFECTION SUSPECTED) - Abnormal; Notable for the following components:   Hgb urine dipstick MODERATE (*)    Leukocytes,Ua SMALL (*)    Bacteria, UA RARE (*)    All other components within normal limits  URINALYSIS, ROUTINE W REFLEX MICROSCOPIC - Abnormal; Notable for the following components:   Specific Gravity, Urine 1.031 (*)    Hgb urine dipstick MODERATE (*)    Ketones, ur 15 (*)    Protein, ur 30 (*)    Leukocytes,Ua SMALL (*)    Bacteria, UA RARE (*)    All other components within normal limits  SARS CORONAVIRUS  2 BY RT PCR  URINE CULTURE  PREGNANCY, URINE  LIPASE, BLOOD    EKG EKG Interpretation Date/Time:  Friday August 04 2023 00:09:28 EST Ventricular Rate:  113 PR Interval:  130 QRS Duration:  60 QT Interval:  312 QTC Calculation: 427 R Axis:   87  Text Interpretation: Sinus tachycardia Nonspecific ST and T wave abnormality Abnormal ECG No previous ECGs available Confirmed by Marily Memos [phone]) on 08/04/2023 2:18:01 AM  Radiology DG Chest Port 1 View Result Date: 08/04/2023 CLINICAL DATA:  Shortness of breath EXAM: PORTABLE CHEST 1 VIEW COMPARISON:  None Available. FINDINGS: The heart size and mediastinal contours are within normal limits. Both lungs are clear. The visualized skeletal structures are unremarkable. IMPRESSION: No active disease. Electronically Signed   By: Darliss Cheney M.D.   On: 08/04/2023 03:03    Procedures Procedures    Medications Ordered in  ED Medications - No data to display  ED Course/ Medical Decision Making/ A&P                                 Medical Decision Making Amount and/or Complexity of Data Reviewed Labs: ordered. Radiology: ordered.   Likely viral illness.  Patient is feeling better with improved vital signs at time of discharge.  Low suspicion for bacteremia, bacterial sepsis, meningitis or other acute etiology for symptoms.  She will continue symptomatic care at home.  PCP follow-up if not proving here if things worsen.   Final Clinical Impression(s) / ED Diagnoses Final diagnoses:  Tachycardia  Fever, unspecified fever cause    Rx / DC Orders ED Discharge Orders     None         Johannah Rozas, Barbara Cower, MD 08/05/23 [phone]

## 2023-08-06 LAB — URINE CULTURE

## 2023-08-23 ENCOUNTER — Other Ambulatory Visit: Payer: Self-pay | Admitting: Medical Genetics

## 2023-11-09 NOTE — Progress Notes (Signed)
 Office Visit Note  Patient: Mary Khan             Date of Birth: 1995-08-20           MRN: [id number]             PCP: Vladimir Faster Referring: Ernest Mallick, [place]* Visit Date: 11/14/2023 Occupation: @GUAROCC @  Subjective:  Pain in both hands and positive ANA  History of Present Illness: Mary Khan is a 28 y.o. female seen for the evaluation of positive ANA.  According the patient she delivered a baby boy in October 2024.  She states she had preeclampsia with this pregnancy.  He was born full-term.  In January 2025 she developed a rash on her left breast which was scaly and resolved after using Aquaphor.  She also noticed hair loss in the frontal region.  She states gradually the hair loss is improved but still she has significant hair loss.  She states she had 2 episodes of chest pain in January which lasted for about 15 minutes and then resolved.  She had no recurrence of chest pain since then.  She states she went to see her PCP and had labs which showed positive ANA.  She was referred to [facility] rheumatology where she was evaluated in March.  She states she had thorough workup there and no suggestions were made.  She was found to have elevated LFTs on the lab work.  She has not been taking any anti-inflammatories send does not drink any alcohol.  She states for the last month she has been having increased discomfort in her hands.  She has been having morning stiffness and swelling in her hands in the morning.  She is also noticing decreased grip strength.  None of the joints are painful.  She gives history of fatigue.  There is no history of dry mouth, dry eyes, oral ulcers, nasal ulcers, malar rash, Raynaud's, photosensitivity or lymphadenopathy.  She is right-handed married, gravida 2, para 2.  Birth control method is condoms.  There is no history of alcohol intake or smoking.  Her father has rheumatoid arthritis and her first cousin on maternal side has  lupus.    Activities of Daily Living:  Patient reports morning stiffness for all day. Patient Reports nocturnal pain.  Difficulty dressing/grooming: Denies Difficulty climbing stairs: Denies Difficulty getting out of chair: Denies Difficulty using hands for taps, buttons, cutlery, and/or writing: Reports  Review of Systems  Constitutional:  Positive for fatigue.  HENT:  Negative for mouth sores and mouth dryness.   Eyes:  Negative for dryness.  Respiratory:  Positive for shortness of breath.   Cardiovascular:  Positive for chest pain. Negative for palpitations.  Gastrointestinal:  Negative for blood in stool, constipation and diarrhea.  Endocrine: Negative for increased urination.  Genitourinary:  Negative for involuntary urination.  Musculoskeletal:  Positive for joint pain, joint pain, joint swelling and morning stiffness. Negative for gait problem, myalgias, muscle weakness, muscle tenderness and myalgias.  Skin:  Positive for rash and hair loss. Negative for color change and sensitivity to sunlight.  Allergic/Immunologic: Negative for susceptible to infections.  Neurological:  Negative for dizziness and headaches.  Hematological:  Negative for swollen glands.  Psychiatric/Behavioral:  Positive for sleep disturbance. Negative for depressed mood. The patient is not nervous/anxious.     PMFS History:  Patient Active Problem List   Diagnosis Date Noted   Less than [redacted] weeks gestation of pregnancy 10/06/2022   Pregnancy  examination or test, positive result 10/06/2022   History of gestational diabetes in prior pregnancy, currently pregnant 10/06/2022   Encounter to determine fetal viability of pregnancy 10/06/2022   PCOS (polycystic ovarian syndrome) 05/14/2019    Past Medical History:  Diagnosis Date   Amenorrhea    PCOS (polycystic ovarian syndrome)     Family History  Problem Relation Age of Onset   Diabetes Mother    Anemia Father    Rheum arthritis Father     Hyperlipidemia Maternal Grandmother    Diabetes Paternal Grandmother    Bone cancer Paternal Grandmother    Diabetes Paternal Grandfather    Healthy Son    Healthy Son    Lupus Cousin    Past Surgical History:  Procedure Laterality Date   DILATION & EVACUATION OF RETAINED PLACENTA  05/25/2023   Social History   Social History Narrative   Not on file   Immunization History  Administered Date(s) Administered   HPV Quadrivalent 08/15/2012   Tdap 08/16/2015     Objective: Vital Signs: BP (!) 146/95 (BP Location: Right Arm, Patient Position: Sitting, Cuff Size: Normal)   Pulse 73   Ht 5\' 4"  (1.626 m)   Wt 177 lb 3.2 oz (80.4 kg)   LMP 11/10/2023   BMI 30.42 kg/m    Physical Exam Vitals and nursing note reviewed.  Constitutional:      Appearance: She is well-developed.  HENT:     Head: Normocephalic and atraumatic.  Eyes:     Conjunctiva/sclera: Conjunctivae normal.  Cardiovascular:     Rate and Rhythm: Normal rate and regular rhythm.     Heart sounds: Normal heart sounds.  Pulmonary:     Effort: Pulmonary effort is normal.     Breath sounds: Normal breath sounds.  Abdominal:     General: Bowel sounds are normal.     Palpations: Abdomen is soft.  Musculoskeletal:     Cervical back: Normal range of motion.  Lymphadenopathy:     Cervical: No cervical adenopathy.  Skin:    General: Skin is warm and dry.     Capillary Refill: Capillary refill takes less than 2 seconds.  Neurological:     Mental Status: She is alert and oriented to person, place, and time.  Psychiatric:        Behavior: Behavior normal.      Musculoskeletal Exam: Cervical, thoracic and lumbar spine 1 good range of motion.  Shoulders, elbows, wrist joints, MCPs PIPs and DIPs been good range of motion with no synovitis.  She had no tenderness over MCPs or PIPs today.  No synovitis was noted.  Hip joints and knee joints in good range of motion without any warmth swelling or effusion.  There was no  tenderness over ankles or MTPs.  CDAI Exam: CDAI Score: -- Patient Global: --; Provider Global: -- Swollen: --; Tender: -- Joint Exam 11/14/2023   No joint exam has been documented for this visit   There is currently no information documented on the homunculus. Go to the Rheumatology activity and complete the homunculus joint exam.  Investigation: No additional findings.  Imaging: No results found.  Recent Labs: Lab Results  Component Value Date   WBC 8.3 08/04/2023   HGB 12.9 08/04/2023   PLT 191 08/04/2023   NA 140 08/04/2023   K 3.4 (L) 08/04/2023   CL 103 08/04/2023   CO2 27 08/04/2023   GLUCOSE 93 08/04/2023   BUN 7 08/04/2023   CREATININE 0.91 08/04/2023  BILITOT 0.5 08/04/2023   ALKPHOS 86 08/04/2023   AST 24 08/04/2023   ALT 41 08/04/2023   PROT 8.1 08/04/2023   ALBUMIN 4.8 08/04/2023   CALCIUM 9.5 08/04/2023   GFRAA >60 04/22/2017   September 21, 2023 iron studies normal, CBC WBC 5.0, hemoglobin 12.2, platelets 486, anti-CCP 4, ANA 1: 160,, dsDNA negative, RNP negative, Smith negative, SCL 70 negative, SSA negative, SSB negative, antichromatin negative, anti-Jo1 negative, anticentromere negative, RF negative, TSH normal, vitamin D23.3,  Speciality Comments: No specialty comments available.  Procedures:  No procedures performed Allergies: Patient has no known allergies.   Assessment / Plan:     Visit Diagnoses: Positive ANA (antinuclear antibody) -patient complains of discomfort in her hands for the last 1 month.  She was found to have positive ANA by her PCP in February when she had hair loss and a rash on her chest.  The rash on her chest resolved after the use of Aquaphor.  Hair loss has been gradually improving.  She denies any history of oral ulcers, nasal ulcers, malar rash, photosensitivity, Raynaud's or lymphadenopathy.  She has been experiencing discomfort in her hands for the last 1 month.  She notices stiffness in the morning.  Association of  positive ANA with rheumatoid arthritis was also discussed.  ENA panel was negative in the past.  I will obtain additional antibodies.  Patient also had preeclampsia in October 2024 with childbirth.  Plan: ANA, C3 and C4, Beta-2 glycoprotein antibodies, Cardiolipin antibodies, IgG, IgM, IgA, Lupus Anticoagulant Eval w/Reflex.  I will obtain next ultrasound of bilateral hands to look for synovitis.  Limited ultrasound of bilateral hands was obtained today.  No synovitis was noted on the ultrasound examination.  Ultrasound results were reviewed with the patient.  Pain in both hands -she complains of discomfort in the bilateral hands and morning stiffness.  No synovitis was noted on the examination.  Patient describes swelling over the palmar aspect of her hands over the proximal phalanx.  Plan: Cyclic citrul peptide antibody, IgG, Mutated Citrullinated Vimentin (MCV) Antibody, [place] LIMITED JOINT SPACE STRUCTURES UP BILAT  Hair loss-patient had hair loss in January which was treatments after the childbirth.  She states that hair loss is gradually improving.  She had thinning of frontal hair.  Rash-she had rash on her left side of chest which was scaly per patient and resolved after the use of his Aquaphor.  There is no history of malar rash.  Other chest pain-she reports 2 episodes of chest pain in January lasting for 15 minutes and then resolved.  She had no recurrence of chest pain.  There is no history of palpitations or shortness of breath.  Elevated LFTs -she had recent labs in March which showed ALT elevation of 41.  Held recheck LFTs.  Patient denies any intake of NSAIDs.  Plan: Comprehensive metabolic panel with GFR  Other eczema-she gives history of eczema.  Vitamin D deficiency-vitamin D was low at 23.3.  I will send a prescription for vitamin D 50,000 units once a week for 3 months.  Recheck vitamin D level in 3 months.  Alpha thalassemia (HCC)  Family history of rheumatoid  arthritis-father  Family history of systemic lupus erythematosus-maternal first cousin  Orders: Orders Placed This Encounter  Procedures   [place] LIMITED JOINT SPACE STRUCTURES UP BILAT   Comprehensive metabolic panel with GFR   Cyclic citrul peptide antibody, IgG   ANA   C3 and C4   Beta-2 glycoprotein antibodies   Cardiolipin  antibodies, IgG, IgM, IgA   Lupus Anticoagulant Eval w/Reflex   Mutated Citrullinated Vimentin (MCV) Antibody   No orders of the defined types were placed in this encounter.    Follow-Up Instructions: Return for Positive ANA, joint pain.   Pollyann Savoy, MD  Note - This record has been created using Animal nutritionist.  Chart creation errors have been sought, but may not always  have been located. Such creation errors do not reflect on  the standard of medical care.

## 2023-11-14 ENCOUNTER — Encounter: Payer: Self-pay | Admitting: Rheumatology

## 2023-11-14 ENCOUNTER — Ambulatory Visit: Attending: Rheumatology | Admitting: Rheumatology

## 2023-11-14 ENCOUNTER — Ambulatory Visit

## 2023-11-14 VITALS — BP 136/92 | HR 78 | Ht 64.0 in | Wt 177.2 lb

## 2023-11-14 DIAGNOSIS — M79642 Pain in left hand: Secondary | ICD-10-CM

## 2023-11-14 DIAGNOSIS — R768 Other specified abnormal immunological findings in serum: Secondary | ICD-10-CM

## 2023-11-14 DIAGNOSIS — L308 Other specified dermatitis: Secondary | ICD-10-CM

## 2023-11-14 DIAGNOSIS — L659 Nonscarring hair loss, unspecified: Secondary | ICD-10-CM | POA: Diagnosis not present

## 2023-11-14 DIAGNOSIS — R0789 Other chest pain: Secondary | ICD-10-CM

## 2023-11-14 DIAGNOSIS — Z8269 Family history of other diseases of the musculoskeletal system and connective tissue: Secondary | ICD-10-CM

## 2023-11-14 DIAGNOSIS — E559 Vitamin D deficiency, unspecified: Secondary | ICD-10-CM

## 2023-11-14 DIAGNOSIS — M79641 Pain in right hand: Secondary | ICD-10-CM

## 2023-11-14 DIAGNOSIS — R21 Rash and other nonspecific skin eruption: Secondary | ICD-10-CM | POA: Diagnosis not present

## 2023-11-14 DIAGNOSIS — D560 Alpha thalassemia: Secondary | ICD-10-CM

## 2023-11-14 DIAGNOSIS — R7989 Other specified abnormal findings of blood chemistry: Secondary | ICD-10-CM

## 2023-11-14 DIAGNOSIS — Z8261 Family history of arthritis: Secondary | ICD-10-CM

## 2023-11-14 MED ORDER — VITAMIN D (ERGOCALCIFEROL) 1.25 MG (50000 UNIT) PO CAPS: 50000.0000 [IU] | ORAL_CAPSULE | ORAL | 0 refills | Status: AC

## 2023-11-17 LAB — ANA: Anti Nuclear Antibody (ANA): NEGATIVE

## 2023-11-17 LAB — COMPREHENSIVE METABOLIC PANEL WITH GFR
AG Ratio: 1.7 (calc) (ref 1.0–2.5)
ALT: 38 U/L — ABNORMAL HIGH (ref 6–29)
AST: 24 U/L (ref 10–30)
Albumin: 4.9 g/dL (ref 3.6–5.1)
Alkaline phosphatase (APISO): 92 U/L (ref 31–125)
BUN: 9 mg/dL (ref 7–25)
CO2: 30 mmol/L (ref 20–32)
Calcium: 9.5 mg/dL (ref 8.6–10.2)
Chloride: 103 mmol/L (ref 98–110)
Creat: 0.79 mg/dL (ref 0.50–0.96)
Globulin: 2.9 g/dL (ref 1.9–3.7)
Glucose, Bld: 85 mg/dL (ref 65–99)
Potassium: 3.6 mmol/L (ref 3.5–5.3)
Sodium: 142 mmol/L (ref 135–146)
Total Bilirubin: 0.4 mg/dL (ref 0.2–1.2)
Total Protein: 7.8 g/dL (ref 6.1–8.1)
eGFR: 106 mL/min/{1.73_m2} (ref >=60)

## 2023-11-17 LAB — C3 AND C4
C3 Complement: 172 mg/dL (ref 83–193)
C4 Complement: 34 mg/dL (ref 15–57)

## 2023-11-17 LAB — CARDIOLIPIN ANTIBODIES, IGG, IGM, IGA
Anticardiolipin IgA: <2 [APL'U]/mL (ref <20.0)
Anticardiolipin IgG: <2 [GPL'U]/mL (ref <20.0)
Anticardiolipin IgM: <2 [MPL'U]/mL (ref <20.0)

## 2023-11-17 LAB — BETA-2 GLYCOPROTEIN ANTIBODIES
Beta-2 Glyco 1 IgA: <2 U/mL (ref <20.0)
Beta-2 Glyco 1 IgM: <2 U/mL (ref <20.0)
Beta-2 Glyco I IgG: <2 U/mL (ref <20.0)

## 2023-11-17 LAB — CYCLIC CITRUL PEPTIDE ANTIBODY, IGG: Cyclic Citrullin Peptide Ab: <16 U

## 2023-11-17 LAB — LUPUS ANTICOAGULANT EVAL W/ REFLEX
PTT-LA Screen: 30 s (ref <=40)
dRVVT: 29 s (ref <=45)

## 2023-11-17 LAB — MUTATED CITRULLINATED VIMENTIN (MCV) ANTIBODY: MUTATED CITRULLINATED VIMENTIN (MCV) AB: <20 U/mL (ref <20)

## 2023-11-17 NOTE — Progress Notes (Signed)
 Liver function is mildly elevated.  Patient should avoid NSAIDs and alcohol use.  ANA negative, complements normal, beta-2 GP 1 and anticardiolipin negative, lupus anticoagulant negative, anti-CCP negative.  vitamin D and MCV antibodies are pending.  I will discuss results at the follow-up visit.

## 2023-11-28 NOTE — Progress Notes (Deleted)
 Office Visit Note  Patient: Mary Khan             Date of Birth: 11-10-1995           MRN: [id number]             PCP: Vladimir Faster Referring: Ernest Mallick, [place]* Visit Date: 12/12/2023 Occupation: @GUAROCC @  Subjective:  No chief complaint on file.   History of Present Illness: Mary Khan is a 28 y.o. female ***     Activities of Daily Living:  Patient reports morning stiffness for *** {minute/hour:19697}.   Patient {ACTIONS;DENIES/REPORTS:21021675::"Denies"} nocturnal pain.  Difficulty dressing/grooming: {ACTIONS;DENIES/REPORTS:21021675::"Denies"} Difficulty climbing stairs: {ACTIONS;DENIES/REPORTS:21021675::"Denies"} Difficulty getting out of chair: {ACTIONS;DENIES/REPORTS:21021675::"Denies"} Difficulty using hands for taps, buttons, cutlery, and/or writing: {ACTIONS;DENIES/REPORTS:21021675::"Denies"}  No Rheumatology ROS completed.   PMFS History:  Patient Active Problem List   Diagnosis Date Noted   Less than [redacted] weeks gestation of pregnancy 10/06/2022   Pregnancy examination or test, positive result 10/06/2022   History of gestational diabetes in prior pregnancy, currently pregnant 10/06/2022   Encounter to determine fetal viability of pregnancy 10/06/2022   PCOS (polycystic ovarian syndrome) 05/14/2019    Past Medical History:  Diagnosis Date   Amenorrhea    PCOS (polycystic ovarian syndrome)     Family History  Problem Relation Age of Onset   Diabetes Mother    Anemia Father    Rheum arthritis Father    Hyperlipidemia Maternal Grandmother    Diabetes Paternal Grandmother    Bone cancer Paternal Grandmother    Diabetes Paternal Grandfather    Healthy Son    Healthy Son    Lupus Cousin    Past Surgical History:  Procedure Laterality Date   DILATION & EVACUATION OF RETAINED PLACENTA  05/25/2023   Social History   Social History Narrative   Not on file   Immunization History  Administered Date(s) Administered   HPV  Quadrivalent 08/15/2012   Tdap 08/16/2015     Objective: Vital Signs: LMP 11/10/2023    Physical Exam   Musculoskeletal Exam: ***  CDAI Exam: CDAI Score: -- Patient Global: --; Provider Global: -- Swollen: --; Tender: -- Joint Exam 12/12/2023   No joint exam has been documented for this visit   There is currently no information documented on the homunculus. Go to the Rheumatology activity and complete the homunculus joint exam.  Investigation: No additional findings.  Imaging: [place] LIMITED JOINT SPACE STRUCTURES UP BILAT Result Date: 11/14/2023 Ultrasound examination of bilateral hands was performed per EULAR recommendations. Using 15 MHz transducer, grayscale and power Doppler bilateral second and third MCP joints both dorsal and volar aspects were evaluated to look for synovitis or tenosynovitis. The findings were there was no synovitis or tenosynovitis on ultrasound examination. Impression: No synovitis was noted on the limited ultrasound examination of both hands.   Recent Labs: Lab Results  Component Value Date   WBC 8.3 08/04/2023   HGB 12.9 08/04/2023   PLT 191 08/04/2023   NA 142 11/14/2023   K 3.6 11/14/2023   CL 103 11/14/2023   CO2 30 11/14/2023   GLUCOSE 85 11/14/2023   BUN 9 11/14/2023   CREATININE 0.79 11/14/2023   BILITOT 0.4 11/14/2023   ALKPHOS 86 08/04/2023   AST 24 11/14/2023   ALT 38 (H) 11/14/2023   PROT 7.8 11/14/2023   ALBUMIN 4.8 08/04/2023   CALCIUM 9.5 11/14/2023   GFRAA >60 04/22/2017   November 14, 2023 ANA negative, C3-C4 normal, anticardiolipin negative,  beta-2 GP 1 negative, lupus anticoagulant negative, anti-CCP negative, MCV negative Speciality Comments: No specialty comments available.  Procedures:  No procedures performed Allergies: Patient has no known allergies.   Assessment / Plan:     Visit Diagnoses: No diagnosis found.  Orders: No orders of the defined types were placed in this encounter.  No orders of the defined types  were placed in this encounter.   Face-to-face time spent with patient was *** minutes. Greater than 50% of time was spent in counseling and coordination of care.  Follow-Up Instructions: No follow-ups on file.   Nicholas Bari, MD  Note - This record has been created using Animal nutritionist.  Chart creation errors have been sought, but may not always  have been located. Such creation errors do not reflect on  the standard of medical care.

## 2023-12-11 ENCOUNTER — Telehealth: Payer: Self-pay | Admitting: Rheumatology

## 2023-12-11 NOTE — Telephone Encounter (Signed)
 Pt called requesting if she could do her NPT FU visit tomorrow at 11:20 as a telehealth visit instead.

## 2023-12-11 NOTE — Telephone Encounter (Signed)
 New patient appointment: 11/14/2023 Labs: Liver function is mildly elevated.  Patient should avoid NSAIDs and alcohol use.  ANA negative, complements normal, beta-2  GP 1 and anticardiolipin negative, lupus anticoagulant negative, anti-CCP negative.   MCV antibody <20  Ultrasound performed on 11/14/2023: No synovitis was noted on the limited ultrasound examination of both hands.   Please advise patient's request of a virtual visit on 12/12/2023 at 11:20am. Thanks!

## 2023-12-12 ENCOUNTER — Encounter: Payer: Self-pay | Admitting: Rheumatology

## 2023-12-12 ENCOUNTER — Ambulatory Visit: Attending: Rheumatology | Admitting: Rheumatology

## 2023-12-12 VITALS — Ht 63.0 in

## 2023-12-12 DIAGNOSIS — E559 Vitamin D deficiency, unspecified: Secondary | ICD-10-CM | POA: Insufficient documentation

## 2023-12-12 DIAGNOSIS — R768 Other specified abnormal immunological findings in serum: Secondary | ICD-10-CM | POA: Insufficient documentation

## 2023-12-12 DIAGNOSIS — M79641 Pain in right hand: Secondary | ICD-10-CM | POA: Diagnosis not present

## 2023-12-12 DIAGNOSIS — D560 Alpha thalassemia: Secondary | ICD-10-CM | POA: Diagnosis present

## 2023-12-12 DIAGNOSIS — R0789 Other chest pain: Secondary | ICD-10-CM | POA: Insufficient documentation

## 2023-12-12 DIAGNOSIS — R21 Rash and other nonspecific skin eruption: Secondary | ICD-10-CM | POA: Diagnosis not present

## 2023-12-12 DIAGNOSIS — R7989 Other specified abnormal findings of blood chemistry: Secondary | ICD-10-CM | POA: Diagnosis present

## 2023-12-12 DIAGNOSIS — Z8269 Family history of other diseases of the musculoskeletal system and connective tissue: Secondary | ICD-10-CM | POA: Diagnosis present

## 2023-12-12 DIAGNOSIS — L308 Other specified dermatitis: Secondary | ICD-10-CM | POA: Insufficient documentation

## 2023-12-12 DIAGNOSIS — M79642 Pain in left hand: Secondary | ICD-10-CM | POA: Diagnosis present

## 2023-12-12 DIAGNOSIS — L659 Nonscarring hair loss, unspecified: Secondary | ICD-10-CM | POA: Diagnosis not present

## 2023-12-12 DIAGNOSIS — Z8261 Family history of arthritis: Secondary | ICD-10-CM | POA: Diagnosis present

## 2023-12-12 NOTE — Telephone Encounter (Signed)
 We can try virtual visit at the time of diagnosis which should be towards the end of the morning.

## 2023-12-12 NOTE — Progress Notes (Signed)
 Virtual Visit via Video Note  I connected with Mary Khan on 12/12/23 at 11:20 AM EDT by a video enabled telemedicine application and verified that I am speaking with the correct person using two identifiers.  Location: Patient: Home Provider: Office   I discussed the limitations of evaluation and management by telemedicine and the availability of in person appointments. The patient expressed understanding and agreed to proceed.  Visit diagnosis: Joint pain and abnormal labs History of Present Illness: Patient states that she started taking vitamin D  after we called in the prescription for vitamin D  50,000 units once a week.  She states gradually the joint pain has improved.  She is not having any muscle pain.  She denies any discomfort in any of her joints today.  She denies any history of oral ulcers, nasal ulcers, malar rash, photosensitivity, lymphadenopathy.  She continues to have some fatigue and some intermittent lower back pain. Review of Systems  Constitutional:  Positive for malaise/fatigue.  HENT:  Negative for congestion.   Eyes:  Negative for redness.  Respiratory:  Negative for shortness of breath.   Cardiovascular:  Negative for chest pain and palpitations.  Gastrointestinal:  Negative for constipation and diarrhea.  Genitourinary:  Negative for frequency and urgency.  Musculoskeletal:  Positive for back pain. Negative for joint pain.  Skin:  Negative for rash.  Neurological:  Negative for weakness and headaches.  Endo/Heme/Allergies:  Does not bruise/bleed easily.  Psychiatric/Behavioral:  Negative for depression. The patient is not nervous/anxious.     Patient reports morning stiffness for 0  none .   Patient denies nocturnal pain.  Difficulty dressing/grooming: Denies Difficulty climbing stairs: Denies Difficulty getting out of chair: Denies Difficulty using hands for taps, buttons, cutlery, and/or writing: Denies     Observations/Objective: Physical  Exam Constitutional:      Appearance: Normal appearance.  Neurological:     Mental Status: She is alert and oriented to person, place, and time.  Psychiatric:        Mood and Affect: Mood normal.        Behavior: Behavior normal.      November 14, 2023 ANA negative, C3-C4 normal, anticardiolipin negative, beta-2  GP 1 negative, lupus anticoagulant negative, anti-CCP negative, MCV negative  Assessment and Plan: Positive ANA-Repeat ANA negative, complements normal, beta-2  GP 1, anticardiolipin and lupus anticoagulant negative.  Lab results was reviewed with the patient.  I advised her to contact me if she develops any new symptoms in the future.  Pain in both hands-History of morning stiffness and joint discomfort. No synovitis noted at the last visit.  Anti-CCP, MCV negative. November 14, 2023 ultrasound of bilateral hands negative for synovitis.  Patient states the discomfort in her hands has improved since she has been taking vitamin D .  Hair loss-Started after childbirth which has been gradually improving.   Rash-Patient reports the rash on her chest resolved after the use of Aquaphor.   Eczema  Chest pain-She had 2 episodes of chest pain in January with no recurrence   Elevated LFTs-AST 24 and ALT 38 on November 14, 2023. Some improvement is noted.  Patient denies taking NSAIDs or Tylenol .  She does not drink any alcohol.  She will follow-up with her PCP.  Vitamin D  deficiency-vitamin D  was low at 23.3 on September 21, 2023.  She was placed on vitamin D  50,000 units once a week for 3 months.  She was advised to take vitamin D  2000 units daily.  I also advised her to  have repeat vitamin D  level in 6 months with her PCP.  Alpha thalassemia  Family history of rheumatoid arthritis-father   Family history of systemic lupus erythematosus-maternal first cousin    Follow Up Instructions:    I discussed the assessment and treatment plan with the patient. The patient was provided an opportunity  to ask questions and all were answered. The patient agreed with the plan and demonstrated an understanding of the instructions.   The patient was advised to call back or seek an in-person evaluation if the symptoms worsen or if the condition fails to improve as anticipated.  I provided 15 minutes of non-face-to-face time during this encounter.   Nicholas Bari, MD

## 2024-01-16 ENCOUNTER — Encounter: Payer: Medicaid Other | Admitting: Rheumatology

## 2024-02-15 ENCOUNTER — Ambulatory Visit: Payer: Medicaid Other | Admitting: Rheumatology

## 2024-03-11 ENCOUNTER — Encounter (HOSPITAL_BASED_OUTPATIENT_CLINIC_OR_DEPARTMENT_OTHER): Payer: Self-pay

## 2024-03-11 ENCOUNTER — Emergency Department (HOSPITAL_BASED_OUTPATIENT_CLINIC_OR_DEPARTMENT_OTHER)
Admission: EM | Admit: 2024-03-11 | Discharge: 2024-03-11 | Disposition: A | Discharge status: Home / Self Care | Attending: Emergency Medicine | Admitting: Emergency Medicine

## 2024-03-11 ENCOUNTER — Other Ambulatory Visit: Payer: Self-pay

## 2024-03-11 DIAGNOSIS — R197 Diarrhea, unspecified: Secondary | ICD-10-CM | POA: Diagnosis not present

## 2024-03-11 DIAGNOSIS — R112 Nausea with vomiting, unspecified: Secondary | ICD-10-CM | POA: Diagnosis present

## 2024-03-11 HISTORY — DX: Thalassemia, unspecified: D56.9

## 2024-03-11 LAB — COMPREHENSIVE METABOLIC PANEL WITH GFR
ALT: 37 U/L (ref 0–44)
AST: 28 U/L (ref 15–41)
Albumin: 4.8 g/dL (ref 3.5–5.0)
Alkaline Phosphatase: 99 U/L (ref 38–126)
Anion gap: 13 (ref 5–15)
BUN: 8 mg/dL (ref 6–20)
CO2: 24 mmol/L (ref 22–32)
Calcium: 9.8 mg/dL (ref 8.9–10.3)
Chloride: 104 mmol/L (ref 98–111)
Creatinine, Ser: 0.87 mg/dL (ref 0.44–1.00)
GFR, Estimated: >60 mL/min (ref >60)
Glucose, Bld: 111 mg/dL — ABNORMAL HIGH (ref 70–99)
Potassium: 3.6 mmol/L (ref 3.5–5.1)
Sodium: 140 mmol/L (ref 135–145)
Total Bilirubin: 0.4 mg/dL (ref 0.0–1.2)
Total Protein: 7.9 g/dL (ref 6.5–8.1)

## 2024-03-11 LAB — URINALYSIS, ROUTINE W REFLEX MICROSCOPIC
Bacteria, UA: NONE SEEN
Bilirubin Urine: NEGATIVE
Glucose, UA: NEGATIVE mg/dL
Ketones, ur: NEGATIVE mg/dL
Leukocytes,Ua: NEGATIVE
Nitrite: NEGATIVE
Protein, ur: NEGATIVE mg/dL
Specific Gravity, Urine: 1.013 (ref 1.005–1.030)
pH: 8 (ref 5.0–8.0)

## 2024-03-11 LAB — CBC WITH DIFFERENTIAL/PLATELET
Abs Immature Granulocytes: 0.04 K/uL (ref 0.00–0.07)
Basophils Absolute: 0 K/uL (ref 0.0–0.1)
Basophils Relative: 0 %
Eosinophils Absolute: 0.1 K/uL (ref 0.0–0.5)
Eosinophils Relative: 1 %
HCT: 37.8 % (ref 36.0–46.0)
Hemoglobin: 12 g/dL (ref 12.0–15.0)
Immature Granulocytes: 0 %
Lymphocytes Relative: 16 %
Lymphs Abs: 1.6 K/uL (ref 0.7–4.0)
MCH: 23.4 pg — ABNORMAL LOW (ref 26.0–34.0)
MCHC: 31.7 g/dL (ref 30.0–36.0)
MCV: 73.8 fL — ABNORMAL LOW (ref 80.0–100.0)
Monocytes Absolute: 0.5 K/uL (ref 0.1–1.0)
Monocytes Relative: 5 %
Neutro Abs: 8.1 K/uL — ABNORMAL HIGH (ref 1.7–7.7)
Neutrophils Relative %: 78 %
Platelets: 276 K/uL (ref 150–400)
RBC: 5.12 MIL/uL — ABNORMAL HIGH (ref 3.87–5.11)
RDW: 13.8 % (ref 11.5–15.5)
WBC: 10.3 K/uL (ref 4.0–10.5)
nRBC: 0 % (ref 0.0–0.2)

## 2024-03-11 LAB — LIPASE, BLOOD: Lipase: 39 U/L (ref 11–51)

## 2024-03-11 LAB — PREGNANCY, URINE: Preg Test, Ur: NEGATIVE

## 2024-03-11 MED ORDER — SIMETHICONE 40 MG/0.6ML PO SUSP (UNIT DOSE)
40.0000 mg | Freq: Once | ORAL | Status: AC
  Administered 2024-03-11: 40 mg via ORAL
  Filled 2024-03-11: qty 0.6

## 2024-03-11 MED ORDER — ALUM & MAG HYDROXIDE-SIMETH 400-400-40 MG/5ML PO SUSP: 15.0000 mL | Freq: Four times a day (QID) | ORAL | 0 refills | Status: AC | PRN

## 2024-03-11 MED ORDER — LACTATED RINGERS IV BOLUS
1000.0000 mL | Freq: Once | INTRAVENOUS | Status: AC
  Administered 2024-03-11: 1000 mL via INTRAVENOUS

## 2024-03-11 MED ORDER — ONDANSETRON HCL 4 MG/2ML IJ SOLN
4.0000 mg | Freq: Once | INTRAMUSCULAR | Status: AC
  Administered 2024-03-11: 4 mg via INTRAVENOUS
  Filled 2024-03-11: qty 2

## 2024-03-11 MED ORDER — ONDANSETRON 4 MG PO TBDP: 4.0000 mg | ORAL_TABLET | Freq: Three times a day (TID) | ORAL | 0 refills | Status: DC | PRN

## 2024-03-11 NOTE — ED Provider Notes (Signed)
 [place] EMERGENCY DEPARTMENT AT [facility] Provider Note   CSN: [id number] Arrival date & time: 03/11/24  9787     Patient presents with: Abdominal Pain   Mary Khan is a 28 y.o. female.   28 year old female who breast-feeds recently gave birth about 9 months ago presents ER today with nonbloody nonbilious emesis and nonbloody diarrhea.  Had some abdominal discomfort couple days ago but now is mostly just a gassy type pain.   Abdominal Pain      Prior to Admission medications   Medication Sig Start Date End Date Taking? Authorizing Provider  alum & mag hydroxide-simeth (MAALOX PLUS) 400-400-40 MG/5ML suspension Take 15 mLs by mouth every 6 (six) hours as needed for indigestion. 03/11/24  Yes Erving Sassano, Selinda, MD  ondansetron  (ZOFRAN -ODT) 4 MG disintegrating tablet Take 1 tablet (4 mg total) by mouth every 8 (eight) hours as needed for vomiting. 03/11/24  Yes Monasia Lair, Selinda, MD  metroNIDAZOLE  (FLAGYL ) 500 MG tablet Take 1 tablet (500 mg total) by mouth 2 (two) times daily. Patient not taking: Reported on 12/12/2023 10/13/22   Emilio Delilah HERO, CNM  Prenatal Vit-Fe Fumarate-FA (PRENATAL VITAMIN PO) Take by mouth.    [provider]  Vitamin D , Ergocalciferol , (DRISDOL ) 1.25 MG (50000 UNIT) CAPS capsule Take 1 capsule (50,000 Units total) by mouth every 7 (seven) days. 11/14/23   Dolphus Reiter, MD    Allergies: Patient has no known allergies.    Review of Systems  Gastrointestinal:  Positive for abdominal pain.    Updated Vital Signs BP 133/79   Pulse 73   Temp 98.8 F (37.1 C) (Oral)   Resp 18   Ht 5' 3 (1.6 m)   Wt 74.4 kg   LMP 02/19/2024 (Exact Date)   SpO2 100%   BMI 29.05 kg/m   Physical Exam Vitals and nursing note reviewed.  Constitutional:      Appearance: She is well-developed.  HENT:     Head: Normocephalic and atraumatic.  Cardiovascular:     Rate and Rhythm: Normal rate and regular rhythm.  Pulmonary:     Effort: No  respiratory distress.     Breath sounds: No stridor.  Abdominal:     General: There is no distension.     Tenderness: There is no abdominal tenderness.  Musculoskeletal:     Cervical back: Normal range of motion.  Neurological:     Mental Status: She is alert.     (all labs ordered are listed, but only abnormal results are displayed) Labs Reviewed  URINALYSIS, ROUTINE W REFLEX MICROSCOPIC - Abnormal; Notable for the following components:      Result Value   Color, Urine COLORLESS (*)    Hgb urine dipstick SMALL (*)    All other components within normal limits  CBC WITH DIFFERENTIAL/PLATELET - Abnormal; Notable for the following components:   RBC 5.12 (*)    MCV 73.8 (*)    MCH 23.4 (*)    Neutro Abs 8.1 (*)    All other components within normal limits  COMPREHENSIVE METABOLIC PANEL WITH GFR - Abnormal; Notable for the following components:   Glucose, Bld 111 (*)    All other components within normal limits  PREGNANCY, URINE  LIPASE, BLOOD    EKG: None  Radiology: No results found.   Procedures   Medications Ordered in the ED  ondansetron  (ZOFRAN ) injection 4 mg (4 mg Intravenous Given 03/11/24 0341)  lactated ringers  bolus 1,000 mL (1,000 mLs Intravenous New Bag/Given 03/11/24 0341)  simethicone  (MYLICON) 40 mg/0.40ml suspension 40 mg (40 mg Oral Given 03/11/24 0340)                                    Medical Decision Making Amount and/or Complexity of Data Reviewed Labs: ordered.  Risk OTC drugs. Prescription drug management.   Symptomatic treatment.  Labs reassuring.  Not septic appearing.  Not dehydrated.  Abdomen is benign and nonsurgical.  Stable for discharge with symptomatic care and close PCP follow-up.     Final diagnoses:  Diarrhea, unspecified type  Nausea and vomiting, unspecified vomiting type    ED Discharge Orders          Ordered    alum & mag hydroxide-simeth (MAALOX PLUS) 400-400-40 MG/5ML suspension  Every 6 hours PRN         03/11/24 0458    ondansetron  (ZOFRAN -ODT) 4 MG disintegrating tablet  Every 8 hours PRN        03/11/24 0458               Ryoma Nofziger, Selinda, MD 03/11/24 9489

## 2024-03-11 NOTE — ED Triage Notes (Signed)
 Pt states she is having abdominal pain.  N/V/D States her diarrhea is clear and has mucus, which started last night. Fatigue started 2 days ago

## 2024-05-24 ENCOUNTER — Other Ambulatory Visit: Payer: Self-pay | Admitting: Medical Genetics

## 2024-05-24 DIAGNOSIS — Z006 Encounter for examination for normal comparison and control in clinical research program: Secondary | ICD-10-CM

## 2024-06-22 ENCOUNTER — Inpatient Hospital Stay: Admission: RE | Admit: 2024-06-22 | Source: Ambulatory Visit

## 2024-06-22 ENCOUNTER — Ambulatory Visit
Admission: EM | Admit: 2024-06-22 | Discharge: 2024-06-22 | Disposition: A | Discharge status: Home / Self Care | Attending: Family Medicine | Admitting: Family Medicine

## 2024-06-22 ENCOUNTER — Encounter: Payer: Self-pay | Admitting: Emergency Medicine

## 2024-06-22 ENCOUNTER — Ambulatory Visit: Admitting: Radiology

## 2024-06-22 DIAGNOSIS — R0789 Other chest pain: Secondary | ICD-10-CM | POA: Diagnosis not present

## 2024-06-22 DIAGNOSIS — B349 Viral infection, unspecified: Secondary | ICD-10-CM | POA: Diagnosis not present

## 2024-06-22 LAB — GENECONNECT MOLECULAR SCREEN: Genetic Analysis Overall Interpretation: NEGATIVE

## 2024-06-22 MED ORDER — ONDANSETRON 4 MG PO TBDP: 4.0000 mg | ORAL_TABLET | Freq: Three times a day (TID) | ORAL | 0 refills | Status: AC | PRN

## 2024-06-22 NOTE — ED Provider Notes (Signed)
 [facility]    CSN: [id number] Arrival date & time: 06/22/24  1403      History   Chief Complaint Chief Complaint  Patient presents with   Chest Pain    HPI Mary Khan is a 28 y.o. female.    Chest Pain  Here for progression of several symptoms to the last 4 days.  On about November 5 she started having some headache that was sort of a pressure in your ears.  That improved and then on November 6 and 7 she started having nausea and then had a lot of diarrhea yesterday, November 7.  This very frequent.  She had some ondansetron  to take and so has taken that with some good effect for her nausea.  She is still nauseated some today and the diarrhea has lessened and she has only had about 3 stools before she got here to be seen.  We are seeing her about 2 in the afternoon.  No fever or chills noted  No cough or congestion  Today she started having some fleeting chest pains that would last up to 30 seconds at a time, rated as 6 out of 10.  She does not have a history of asthma and she does not feel short of breath  NKDA  Last menstrual cycle was October 13  She is breast-feeding, though she is weaning her toddler. Past Medical History:  Diagnosis Date   Amenorrhea    PCOS (polycystic ovarian syndrome)    Thalassemia     Patient Active Problem List   Diagnosis Date Noted   Less than [redacted] weeks gestation of pregnancy 10/06/2022   Pregnancy examination or test, positive result 10/06/2022   History of gestational diabetes in prior pregnancy, currently pregnant 10/06/2022   Encounter to determine fetal viability of pregnancy 10/06/2022   PCOS (polycystic ovarian syndrome) 05/14/2019    Past Surgical History:  Procedure Laterality Date   DILATION & EVACUATION OF RETAINED PLACENTA  05/25/2023    OB History     Gravida  2   Para  1   Term  1   Preterm  0   AB  0   Living  1      SAB  0   IAB  0   Ectopic  0   Multiple  0   Live Births  1             Home Medications    Prior to Admission medications   Medication Sig Start Date End Date Taking? Authorizing Provider  ondansetron  (ZOFRAN -ODT) 4 MG disintegrating tablet Take 1 tablet (4 mg total) by mouth every 8 (eight) hours as needed for nausea or vomiting. 06/22/24  Yes Seven Dollens K, MD  alum & mag hydroxide-simeth (MAALOX PLUS) 400-400-40 MG/5ML suspension Take 15 mLs by mouth every 6 (six) hours as needed for indigestion. 03/11/24   Mesner, Selinda, MD  Prenatal Vit-Fe Fumarate-FA (PRENATAL VITAMIN PO) Take by mouth.    [provider]  Vitamin D , Ergocalciferol , (DRISDOL ) 1.25 MG (50000 UNIT) CAPS capsule Take 1 capsule (50,000 Units total) by mouth every 7 (seven) days. 11/14/23   Dolphus Reiter, MD    Family History Family History  Problem Relation Age of Onset   Diabetes Mother    Anemia Father    Rheum arthritis Father    Hyperlipidemia Maternal Grandmother    Diabetes Paternal Grandmother    Bone cancer Paternal Grandmother    Diabetes Paternal Grandfather  Healthy Son    Healthy Son    Lupus Cousin     Social History Social History   Tobacco Use   Smoking status: Never    Passive exposure: Never   Smokeless tobacco: Never  Vaping Use   Vaping status: Never Used  Substance Use Topics   Alcohol use: No   Drug use: No     Allergies   Patient has no known allergies.   Review of Systems Review of Systems  Cardiovascular:  Positive for chest pain.     Physical Exam Triage Vital Signs ED Triage Vitals  Encounter Vitals Group     BP 06/22/24 1412 (!) 150/94     Girls Systolic BP Percentile --      Girls Diastolic BP Percentile --      Boys Systolic BP Percentile --      Boys Diastolic BP Percentile --      Pulse Rate 06/22/24 1412 91     Resp 06/22/24 1412 16     Temp 06/22/24 1412 99.2 F (37.3 C)     Temp Source 06/22/24 1412 Oral     SpO2 06/22/24 1412 98 %     Weight --      Height --      Head Circumference  --      Peak Flow --      Pain Score 06/22/24 1411 6     Pain Loc --      Pain Education --      Exclude from Growth Chart --    No data found.  Updated Vital Signs BP (!) 148/95 (BP Location: Right Arm)   Pulse 91   Temp 99.2 F (37.3 C) (Oral)   Resp 16   LMP 05/27/2024 (Exact Date)   SpO2 98%   Breastfeeding Yes   Visual Acuity Right Eye Distance:   Left Eye Distance:   Bilateral Distance:    Right Eye Near:   Left Eye Near:    Bilateral Near:     Physical Exam Vitals reviewed.  Constitutional:      General: She is not in acute distress.    Appearance: She is not toxic-appearing.  HENT:     Right Ear: Tympanic membrane and ear canal normal.     Left Ear: Tympanic membrane and ear canal normal.     Nose: Nose normal.     Mouth/Throat:     Mouth: Mucous membranes are moist.     Pharynx: No oropharyngeal exudate or posterior oropharyngeal erythema.  Eyes:     Extraocular Movements: Extraocular movements intact.     Conjunctiva/sclera: Conjunctivae normal.     Pupils: Pupils are equal, round, and reactive to light.  Cardiovascular:     Rate and Rhythm: Normal rate and regular rhythm.     Heart sounds: No murmur heard. Pulmonary:     Effort: Pulmonary effort is normal. No respiratory distress.     Breath sounds: No stridor. No wheezing, rhonchi or rales.  Chest:     Chest wall: No tenderness.  Abdominal:     General: There is no distension.     Palpations: Abdomen is soft.     Tenderness: There is no abdominal tenderness. There is no guarding.  Musculoskeletal:     Cervical back: Neck supple.  Lymphadenopathy:     Cervical: No cervical adenopathy.  Skin:    Capillary Refill: Capillary refill takes less than 2 seconds.     Coloration: Skin is not jaundiced  or pale.  Neurological:     General: No focal deficit present.     Mental Status: She is alert and oriented to person, place, and time.  Psychiatric:        Behavior: Behavior normal.      UC  Treatments / Results  Labs (all labs ordered are listed, but only abnormal results are displayed) Labs Reviewed - No data to display  EKG   Radiology DG Chest 2 View Result Date: 06/22/2024 CLINICAL DATA:  Atypical chest pain. EXAM: CHEST - 2 VIEW COMPARISON:  08/04/2023. FINDINGS: Trachea is midline. Heart size normal. Lungs are clear. No pleural fluid. IMPRESSION: Negative. Electronically Signed   By: Newell Eke M.D.   On: 06/22/2024 15:11    Procedures Procedures (including critical care time)  Medications Ordered in UC Medications - No data to display  Initial Impression / Assessment and Plan / UC Course  I have reviewed the triage vital signs and the nursing notes.  Pertinent labs & imaging results that were available during my care of the patient were reviewed by me and considered in my medical decision making (see chart for details).     She had done a home COVID test that was negative.  Here I did not feel that there is a need to do another one as she did not have any respiratory symptoms really.  Chest x-ray is done today and is negative.  Discussed with her that I feel that this is most likely due to a viral illness.  Zofran  is sent in for the nausea.  Tylenol  as needed   Final Clinical Impressions(s) / UC Diagnoses   Final diagnoses:  Atypical chest pain  Viral illness     Discharge Instructions      By my review there are no abnormalities on your chest x-ray. The radiologist will also read your x-ray, and if their interpretation differs significantly from mine, and the management of your condition would change, we will call you.  I do think most likely a viral illness is caused all of the symptoms for you.  Ondansetron  dissolved in the mouth every 8 hours as needed for nausea or vomiting. Clear liquids(water, gatorade/pedialyte, ginger ale/sprite, chicken broth/soup) and bland things(crackers/toast, rice, potato, bananas) to eat. Avoid acidic foods  like lemon/lime/orange/tomato, and avoid greasy/spicy foods.  Please follow-up with your primary care    ED Prescriptions     Medication Sig Dispense Auth. Provider   ondansetron  (ZOFRAN -ODT) 4 MG disintegrating tablet Take 1 tablet (4 mg total) by mouth every 8 (eight) hours as needed for nausea or vomiting. 10 tablet Vonna Raychel Dowler K, MD      PDMP not reviewed this encounter.   Vonna Sharlet POUR, MD 06/22/24 [phone]

## 2024-06-22 NOTE — ED Triage Notes (Addendum)
 Pt began having headaches and ear pressure Tuesday and wednesday. Thursday and Friday she had nausea and diarrhea. Yesterday and today she has had chest pain at sternum and some diarrhea.

## 2024-06-22 NOTE — Discharge Instructions (Addendum)
 By my review there are no abnormalities on your chest x-ray. The radiologist will also read your x-ray, and if their interpretation differs significantly from mine, and the management of your condition would change, we will call you.  I do think most likely a viral illness is caused all of the symptoms for you.  Ondansetron  dissolved in the mouth every 8 hours as needed for nausea or vomiting. Clear liquids(water, gatorade/pedialyte, ginger ale/sprite, chicken broth/soup) and bland things(crackers/toast, rice, potato, bananas) to eat. Avoid acidic foods like lemon/lime/orange/tomato, and avoid greasy/spicy foods.  Please follow-up with your primary care
